# Patient Record
Sex: Male | Born: 1960 | Race: White | Marital: Married | Smoking: Current some day smoker
Health system: Southern US, Community
[De-identification: ages and names within clinical notes are randomized; demographics above are authoritative.]

## PROBLEM LIST (undated history)

## (undated) DIAGNOSIS — E785 Hyperlipidemia, unspecified: Secondary | ICD-10-CM

## (undated) DIAGNOSIS — F32A Depression, unspecified: Secondary | ICD-10-CM

## (undated) DIAGNOSIS — M1990 Unspecified osteoarthritis, unspecified site: Secondary | ICD-10-CM

## (undated) DIAGNOSIS — I2699 Other pulmonary embolism without acute cor pulmonale: Secondary | ICD-10-CM

## (undated) DIAGNOSIS — I82409 Acute embolism and thrombosis of unspecified deep veins of unspecified lower extremity: Secondary | ICD-10-CM

## (undated) DIAGNOSIS — I739 Peripheral vascular disease, unspecified: Secondary | ICD-10-CM

## (undated) DIAGNOSIS — K760 Fatty (change of) liver, not elsewhere classified: Secondary | ICD-10-CM

## (undated) DIAGNOSIS — N451 Epididymitis: Secondary | ICD-10-CM

## (undated) DIAGNOSIS — N189 Chronic kidney disease, unspecified: Secondary | ICD-10-CM

## (undated) DIAGNOSIS — C61 Malignant neoplasm of prostate: Secondary | ICD-10-CM

---

## 1983-06-29 HISTORY — PX: KNEE ARTHROSCOPY: SHX127

## 1993-06-28 HISTORY — PX: KNEE ARTHROSCOPY: SHX127

## 1999-06-29 HISTORY — PX: SHOULDER ARTHROSCOPY W/ ROTATOR CUFF REPAIR: SHX2400

## 2013-06-28 HISTORY — PX: TOTAL SHOULDER REPLACEMENT: SUR1217

## 2017-06-28 HISTORY — PX: MEDIAL PARTIAL KNEE REPLACEMENT: SHX5965

## 2018-06-28 HISTORY — PX: PROSTATECTOMY: SHX69

## 2019-07-15 DIAGNOSIS — N529 Male erectile dysfunction, unspecified: Secondary | ICD-10-CM | POA: Insufficient documentation

## 2019-07-15 DIAGNOSIS — C61 Malignant neoplasm of prostate: Secondary | ICD-10-CM | POA: Insufficient documentation

## 2020-02-26 DIAGNOSIS — Z9079 Acquired absence of other genital organ(s): Secondary | ICD-10-CM | POA: Insufficient documentation

## 2020-02-26 DIAGNOSIS — I82409 Acute embolism and thrombosis of unspecified deep veins of unspecified lower extremity: Secondary | ICD-10-CM | POA: Insufficient documentation

## 2020-02-26 DIAGNOSIS — F32A Depression, unspecified: Secondary | ICD-10-CM | POA: Insufficient documentation

## 2020-03-04 DIAGNOSIS — E781 Pure hyperglyceridemia: Secondary | ICD-10-CM | POA: Insufficient documentation

## 2020-06-26 DIAGNOSIS — K760 Fatty (change of) liver, not elsewhere classified: Secondary | ICD-10-CM | POA: Insufficient documentation

## 2020-07-07 DIAGNOSIS — N182 Chronic kidney disease, stage 2 (mild): Secondary | ICD-10-CM | POA: Insufficient documentation

## 2021-01-30 DIAGNOSIS — I482 Chronic atrial fibrillation, unspecified: Secondary | ICD-10-CM | POA: Insufficient documentation

## 2021-08-19 DIAGNOSIS — T8459XA Infection and inflammatory reaction due to other internal joint prosthesis, initial encounter: Secondary | ICD-10-CM | POA: Insufficient documentation

## 2021-08-21 ENCOUNTER — Other Ambulatory Visit: Payer: Self-pay | Admitting: Orthopedic Surgery

## 2021-08-21 DIAGNOSIS — Z96611 Presence of right artificial shoulder joint: Secondary | ICD-10-CM

## 2021-08-28 ENCOUNTER — Other Ambulatory Visit: Payer: Self-pay

## 2021-09-08 ENCOUNTER — Other Ambulatory Visit: Payer: Self-pay

## 2021-09-18 ENCOUNTER — Other Ambulatory Visit: Payer: Self-pay

## 2021-09-18 ENCOUNTER — Ambulatory Visit
Admission: RE | Admit: 2021-09-18 | Discharge: 2021-09-18 | Disposition: A | Discharge status: Home / Self Care | Payer: Self-pay | Source: Ambulatory Visit | Attending: Orthopedic Surgery | Admitting: Orthopedic Surgery

## 2021-09-18 ENCOUNTER — Ambulatory Visit
Admission: RE | Admit: 2021-09-18 | Discharge: 2021-09-18 | Disposition: A | Discharge status: Home / Self Care | Payer: BC Managed Care – PPO | Source: Ambulatory Visit | Attending: Orthopedic Surgery | Admitting: Orthopedic Surgery

## 2021-09-18 ENCOUNTER — Other Ambulatory Visit: Payer: Self-pay | Admitting: Orthopedic Surgery

## 2021-09-18 DIAGNOSIS — Z96611 Presence of right artificial shoulder joint: Secondary | ICD-10-CM

## 2021-09-18 MED ORDER — IOPAMIDOL (ISOVUE-M 200) INJECTION 41%
12.0000 mL | Freq: Once | INTRAMUSCULAR | Status: AC
  Administered 2021-09-18: 12 mL via INTRA_ARTICULAR

## 2021-10-08 NOTE — Progress Notes (Signed)
Surgery orders requested via Epic inbox. °

## 2021-10-14 NOTE — Patient Instructions (Addendum)
2 VISITORS ARE  ALLOWED TO COME WITH YOU AND STAY IN THE WAITING ROOM ONLY DURING PRE OP AND PROCEDURE.   ? ?**NO VISITORS ARE ALLOWED IN THE SHORT STAY AREA OR RECOVERY ROOM!!** ? ?IF YOU WILL BE ADMITTED INTO THE HOSPITAL YOU ARE ALLOWED ONLY 4  SUPPORT PEOPLE DURING VISITATION HOURS ONLY (7 AM -8PM)   ?The support person(s) must pass our screening, gel in and out, and wear a mask at all times, including in the patient?s room. ?Patients must also wear a mask when staff or their support person are in the room. ?Visitors GUEST BADGE MUST BE WORN VISIBLY  ?One adult visitor may remain with you overnight and MUST be in the room by 8 P.M. ?  ? ? Your procedure is scheduled on: 10/21/21 ? ? Report to [facility] Main Entrance ? ?  Report to admitting at 7:15 AM ? ? Call this number if you have problems the morning of surgery [phone] ? ? Do not eat food :After Midnight. ? ? After Midnight you may have the following liquids until _7:00 _AM DAY OF SURGERY ? ?Water ?Black Coffee (sugar ok, NO MILK/CREAM OR CREAMERS)  ?Tea (sugar ok, NO MILK/CREAM OR CREAMERS) regular and decaf                             ?Plain Jell-O (NO RED)                                           ?Fruit ices (not with fruit pulp, NO RED)                                     ?Popsicles (NO RED)                                                                  ?Juice: apple, WHITE grape, WHITE cranberry ?Sports drinks like Gatorade (NO RED) ?Clear broth(vegetable,chicken,beef) ? ?             ? ?  ?  ?The day of surgery:  ?Drink ONE (1) Pre-Surgery Clear Ensure  at  6:45 AM the morning of surgery. Drink in one sitting. Do not sip.  ?This drink was given to you during your hospital  ?pre-op appointment visit. ?Nothing else to drink after completing the  ?Pre-Surgery Clear Ensure at 7:00 AM ?  ?       If you have questions, please contact your surgeon?s office. ? ? ?  ?  ?Oral Hygiene is also important to reduce your risk of infection.                                     ?Remember - BRUSH YOUR TEETH THE MORNING OF SURGERY WITH YOUR REGULAR TOOTHPASTE ? ? Do NOT smoke after Midnight ? ? Take these medicines the morning of surgery with A SIP OF WATER: Fluvoxamine ? ? ?Bring CPAP mask and tubing  day of surgery. ?                  ?           You may not have any metal on your body including , jewelry, and body piercing ? ?           Do not wear lotions, powders, cologne, or deodorant ?  ? ?            Men may shave face and neck. ? ? Do not bring valuables to the hospital. [place] NOT ?            RESPONSIBLE   FOR VALUABLES. ? ? Contacts, dentures or bridgework may not be worn into surgery. ? ? Bring small overnight bag day of surgery. ?  ? Patients discharged on the day of surgery will not be allowed to drive home.  Someone NEEDS to stay with you for the first 24 hours after anesthesia. ? ? Special Instructions: Bring a copy of your healthcare power of attorney and living will documents  the day of surgery if you haven't scanned them before. ? ?            Please read over the following fact sheets you were given: IF [place] [phone] ? ? ?[place]- Preparing for Total Shoulder Arthroplasty  ?  ?Before surgery, you can play an important role. Because skin is not sterile, your skin needs to be as free of germs as possible. You can reduce the number of germs on your skin by using the following products. ?Benzoyl Peroxide Gel ?Reduces the number of germs present on the skin ?Applied twice a day to shoulder area starting two days before surgery   ? ?================================================================== ? ?Please follow these instructions carefully: ? ?BENZOYL PEROXIDE 5% GEL ? ?Please do not use if you have an allergy to benzoyl peroxide.   If your skin becomes reddened/irritated stop using the benzoyl peroxide. ? ?Starting two days before surgery, apply as follows: ?Apply benzoyl  peroxide in the morning and at night. Apply after taking a shower. If you are not taking a shower clean entire shoulder front, back, and side along with the armpit with a clean wet washcloth. ? ?Place a quarter-sized dollop on your shoulder and rub in thoroughly, making sure to cover the front, back, and side of your shoulder, along with the armpit.  ? ?2 days before ____ AM   ____ PM              1 day before ____ AM   ____ PM ?                        ?Do this twice a day for two days.  (Last application is the night before surgery, AFTER using the CHG soap as described below). ? ?Do NOT apply benzoyl peroxide gel on the day of surgery.  ? ?   [place] - Preparing for Surgery ?Before surgery, you can play an important role.  Because skin is not sterile, your skin needs to be as free of germs as possible.  You can reduce the number of germs on your skin by washing with CHG (chlorahexidine gluconate) soap before surgery.  CHG is an antiseptic cleaner which kills germs and bonds with the skin to continue killing germs even after washing. ?Please DO NOT use if you have an  allergy to CHG or antibacterial soaps.  If your skin becomes reddened/irritated stop using the CHG and inform your nurse when you arrive at Short Stay.  You may shave your face/neck. ?Please follow these instructions carefully: ? 1.  Shower with CHG Soap the night before surgery and the  morning of Surgery. ? 2.  If you choose to wash your hair, wash your hair first as usual with your  normal  shampoo. ? 3.  After you shampoo, rinse your hair and body thoroughly to remove the  shampoo.                       ?     4.  Use CHG as you would any other liquid soap.  You can apply chg directly  to the skin and wash  ?                     Gently with a scrungie or clean washcloth. ? 5.  Apply the CHG Soap to your body ONLY FROM THE NECK DOWN.   Do not use on face/ open      ?                     Wound or open sores. Avoid contact with eyes, ears mouth and  genitals (private parts).  ?                     Production manager,  Genitals (private parts) with your normal soap. ?            6.  Wash thoroughly, paying special attention to the area where your surgery  will be performed. ? 7.  Thoroughly rinse your body with warm water from the neck down. ? 8.  DO NOT shower/wash with your normal soap after using and rinsing off  the CHG Soap. ?               9.  Pat yourself dry with a clean towel. ?           10.  Wear clean pajamas. ?           11.  Place clean sheets on your bed the night of your first shower and do not  sleep with pets. ?Day of Surgery : ?Do not apply any lotions/deodorants the morning of surgery.  Please wear clean clothes to the hospital/surgery center. ? ?FAILURE TO FOLLOW THESE INSTRUCTIONS MAY RESULT IN THE CANCELLATION OF YOUR SURGERY ? ? ? ?________________________________________________________________________  ? ?Incentive Spirometer ? ?An incentive spirometer is a tool that can help keep your lungs clear and active. This tool measures how well you are filling your lungs with each breath. Taking long deep breaths may help reverse or decrease the chance of developing breathing (pulmonary) problems (especially infection) following: ?A long period of time when you are unable to move or be active. ?BEFORE THE PROCEDURE  ?If the spirometer includes an indicator to show your best effort, your nurse or respiratory therapist will set it to a desired goal. ?If possible, sit up straight or lean slightly forward. Try not to slouch. ?Hold the incentive spirometer in an upright position. ?INSTRUCTIONS FOR USE  ?Sit on the edge of your bed if possible, or sit up as far as you can in bed or on a chair. ?Hold the incentive spirometer in an upright position. ?Breathe out normally. ?Place the mouthpiece in your mouth  and seal your lips tightly around it. ?Breathe in slowly and as deeply as possible, raising the piston or the ball toward the top of the column. ?Hold your  breath for 3-5 seconds or for as long as possible. Allow the piston or ball to fall to the bottom of the column. ?Remove the mouthpiece from your mouth and breathe out normally. ?Rest for a few seconds

## 2021-10-15 ENCOUNTER — Encounter (HOSPITAL_COMMUNITY): Payer: Self-pay

## 2021-10-15 ENCOUNTER — Other Ambulatory Visit: Payer: Self-pay

## 2021-10-15 ENCOUNTER — Encounter (HOSPITAL_COMMUNITY)
Admission: RE | Admit: 2021-10-15 | Discharge: 2021-10-15 | Disposition: A | Discharge status: Home / Self Care | Payer: BC Managed Care – PPO | Source: Ambulatory Visit | Attending: Orthopaedic Surgery | Admitting: Orthopaedic Surgery

## 2021-10-15 VITALS — BP 135/92 | HR 69 | Temp 97.6°F | Resp 18 | Ht 71.0 in | Wt 237.2 lb

## 2021-10-15 DIAGNOSIS — Z01812 Encounter for preprocedural laboratory examination: Secondary | ICD-10-CM | POA: Insufficient documentation

## 2021-10-15 DIAGNOSIS — N289 Disorder of kidney and ureter, unspecified: Secondary | ICD-10-CM | POA: Diagnosis not present

## 2021-10-15 DIAGNOSIS — Z01818 Encounter for other preprocedural examination: Secondary | ICD-10-CM

## 2021-10-15 HISTORY — DX: Chronic kidney disease, unspecified: N18.9

## 2021-10-15 HISTORY — DX: Other pulmonary embolism without acute cor pulmonale: I26.99

## 2021-10-15 HISTORY — DX: Acute embolism and thrombosis of unspecified deep veins of unspecified lower extremity: I82.409

## 2021-10-15 HISTORY — DX: Epididymitis: N45.1

## 2021-10-15 HISTORY — DX: Fatty (change of) liver, not elsewhere classified: K76.0

## 2021-10-15 HISTORY — DX: Depression, unspecified: F32.A

## 2021-10-15 HISTORY — DX: Hyperlipidemia, unspecified: E78.5

## 2021-10-15 HISTORY — DX: Malignant neoplasm of prostate: C61

## 2021-10-15 LAB — BASIC METABOLIC PANEL
Anion gap: 8 (ref 5–15)
BUN: 27 mg/dL — ABNORMAL HIGH (ref 8–23)
CO2: 24 mmol/L (ref 22–32)
Calcium: 9 mg/dL (ref 8.9–10.3)
Chloride: 108 mmol/L (ref 98–111)
Creatinine, Ser: 1.29 mg/dL — ABNORMAL HIGH (ref 0.61–1.24)
GFR, Estimated: >60 mL/min (ref >60)
Glucose, Bld: 92 mg/dL (ref 70–99)
Potassium: 4.1 mmol/L (ref 3.5–5.1)
Sodium: 140 mmol/L (ref 135–145)

## 2021-10-15 LAB — CBC
HCT: 42.6 % (ref 39.0–52.0)
Hemoglobin: 14 g/dL (ref 13.0–17.0)
MCH: 32 pg (ref 26.0–34.0)
MCHC: 32.9 g/dL (ref 30.0–36.0)
MCV: 97.5 fL (ref 80.0–100.0)
Platelets: 244 10*3/uL (ref 150–400)
RBC: 4.37 MIL/uL (ref 4.22–5.81)
RDW: 13.2 % (ref 11.5–15.5)
WBC: 5.4 10*3/uL (ref 4.0–10.5)
nRBC: 0 % (ref 0.0–0.2)

## 2021-10-15 LAB — SURGICAL PCR SCREEN
MRSA, PCR: NEGATIVE
Staphylococcus aureus: NEGATIVE

## 2021-10-15 NOTE — Progress Notes (Signed)
Anesthesia note: ? ?Bowel prep reminder:NA ? ?PCP - DO M Mahoney and Lamarr Lulas NP [place] , [place] ?Cardiologist -none ?Other-  ? ?Chest x-ray - no ?EKG - no ?Stress Test - no ?ECHO - no ?Cardiac Cath - NA ? ?Pacemaker/ICD device last checked: ? ?Sleep Study - no ?CPAP -  ? ?Pt is pre diabetic-NA ?Fasting Blood Sugar -  ?Checks Blood Sugar _____ ? ?Blood Thinner:Xarelto for PE and DVT/ Dr. Chauncey Reading ?Blood Thinner Instructions:Stop 2 days prior/ Dr. Chauncey Reading ?Aspirin Instructions: ?Last Dose:4/23 ? ?Anesthesia review: no ? ?Patient denies shortness of breath, fever, cough and chest pain at PAT appointment ? ?Pt has no SOB with activities. He was working out prior to shoulder pain ? ?Patient verbalized understanding of instructions that were given to them at the PAT appointment. Patient was also instructed that they will need to review over the PAT instructions again at home before surgery. Yes ?

## 2021-10-20 NOTE — H&P (Signed)
? ? ?PREOPERATIVE H&P ? ?Chief Complaint: right shoulder failed hardware ? ?HPI: ?Chad Levy is a 61 y.o. male who is scheduled for Procedure(s): ?REVISION TOTAL SHOULDER TO REVERSE TOTAL SHOULDER.  ? ?Patient has a past medical history significant for DVT, PE, HLD, CKD.  ? 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  He is here to follow up on what his options are.  He has been very limited with his shoulder since the injury ? ?Symptoms are rated as moderate to severe, and have been worsening.  This is significantly impairing activities of daily living.   ? ?Please see clinic note for further details on this patient's care.   ? ?He has elected for surgical management.  ? ?Past Medical History:  ?Diagnosis Date  ? Chronic kidney disease   ? II  ? Depressive disorder   ? DVT (deep venous thrombosis) ([place])   ? Epididymitis   ? HLD (hyperlipidemia)   ? Prostate cancer ([place])   ? Pulmonary embolism ([place])   ? Steatosis of liver   ? ?Past Surgical History:  ?Procedure Laterality Date  ? KNEE ARTHROSCOPY  1995  ? meniscus removal  ? KNEE ARTHROSCOPY Left 1985  ? MEDIAL PARTIAL KNEE REPLACEMENT Right 2019  ? PROSTATECTOMY  2020  ? SHOULDER ARTHROSCOPY W/ ROTATOR CUFF REPAIR Right 2001  ? TOTAL SHOULDER REPLACEMENT Right 2015  ? ?Social History  ? ?Socioeconomic History  ? Marital status: Married  ?  Spouse name: Not on file  ? Number of children: Not on file  ? Years of education: Not on file  ? Highest education level: Not on file  ?Occupational History  ? Not on file  ?Tobacco Use  ? Smoking status: Former  ?  Packs/day: 0.50  ?  Years: 38.00  ?  Pack years: 19.00  ?  Types: Cigarettes  ?  Quit date: 2020  ?  Years since quitting: 3.3  ? Smokeless tobacco: Current  ?  Types: Chew  ?Vaping Use  ? Vaping Use: Never used  ?Substance and Sexual Activity  ? Alcohol use: Never  ? Drug use: Never  ? Sexual activity: Not on file  ?Other Topics Concern  ? Not on  file  ?Social History Narrative  ? Not on file  ? ?Social Determinants of Health  ? ?Financial Resource Strain: Not on file  ?Food Insecurity: Not on file  ?Transportation Needs: Not on file  ?Physical Activity: Not on file  ?Stress: Not on file  ?Social Connections: Not on file  ? ?No family history on file. ?No Known Allergies ?Prior to Admission medications   ?Medication Sig Start Date End Date Taking? Authorizing Provider  ?acetaminophen (TYLENOL) 500 MG tablet Take 1,000-1,500 mg by mouth every 6 (six) hours as needed for moderate pain or mild pain.   Yes [provider]  ?diclofenac (VOLTAREN) 75 MG EC tablet Take 75 mg by mouth 2 (two) times daily.   Yes [provider]  ?fluvoxaMINE (LUVOX) 100 MG tablet Take 100 mg by mouth 2 (two) times daily. 08/21/21  Yes [provider]  ?rivaroxaban (XARELTO) 20 MG TABS tablet Take 20 mg by mouth daily. 04/21/20  Yes [provider]  ? ? ?ROS: All other systems have been reviewed and were otherwise negative with the exception of those mentioned in the HPI and as above. ? ?Physical Exam: ?General: Alert, no acute distress ?Cardiovascular: No pedal edema ?Respiratory:  No cyanosis, no use of accessory musculature ?GI: No organomegaly, abdomen is soft and non-tender ?Skin: No lesions in the area of chief complaint ?Neurologic: Sensation intact distally ?Psychiatric: Patient is competent for consent with normal mood and affect ?Lymphatic: No axillary or cervical lymphadenopathy ? ?MUSCULOSKELETAL:  ?Active forward elevation to 90, passive to 100.  External rotation of 30, internal rotation to back pocket.  Weak cuff strength testing, supraspinatus and subscapularis.   ? ?Imaging: ?CT arthrogram demonstrates well fixed components with signs of anterior superior escape. ? ?BMI: ?There is no height or weight on file to calculate BMI. ? ?No results found for: ALBUMIN ? ? ?Diabetes: ?Patient does not have a diagnosis of diabetes. ?  ? ?.  ?   ?Smoking Status: ?Social History  ? ?Tobacco Use  ?Smoking Status Former  ? Packs/day: 0.50  ? Years: 38.00  ? Pack years: 19.00  ? Types: Cigarettes  ? Quit date: 2020  ? Years since quitting: 3.3  ?Smokeless Tobacco Current  ? Types: Chew  ? ?Ready to quit: Not Answered ?Counseling given: Not Answered ? ? ? ? ? ? ? ? ? ? ? ? 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 ? ?Plan: ?Plan for Procedure(s): ?REVISION TOTAL SHOULDER TO REVERSE TOTAL SHOULDER ? ?Rotator cuff has failed.  We think it is appropriate to consider a revision to a reverse total shoulder arthroplasty.  We will do this as a one stage.  If there is pus we may end up having to do a spacer.  He understands the risks, including fracture, instability, infection and incomplete resolution of symptoms. ? ?The risks benefits and alternatives were discussed with the patient including but not limited to the risks of nonoperative treatment, versus surgical intervention including infection, bleeding, nerve injury,  blood clots, cardiopulmonary complications, morbidity, mortality, among others, and they were willing to proceed.  ? ?We additionally specifically discussed risks of axillary nerve injury, infection, periprosthetic fracture, continued pain and longevity of implants prior to beginning procedure.  ?  ?Patient will be closely monitored in PACU for medical stabilization and pain control. If found stable in PACU, patient may be discharged home with outpatient follow-up. If any concerns regarding patient's stabilization patient will be admitted for observation after surgery. The patient is planning to be discharged home with outpatient PT.  ? ?The patient acknowledged the explanation, agreed to proceed with the plan and consent was signed.  ? ?He received operative clearance from PCP, Dr. Emelda Fear. He will stop Xarelto 2 days prior to surgery and resume the day after surgery ? ?Operative Plan: Right  revision to reverse total shoulder arthroplasty  ?Discharge Medications: Standard ?DVT Prophylaxis: Resume Xarelto ?Physical Therapy: Outpatient PT ?Special Discharge needs: [facility] [facility] ? ? ?Ethelda Chick, PA-C ? ?10/20/2021 ?1:25 PM ? ?

## 2021-10-21 ENCOUNTER — Inpatient Hospital Stay (HOSPITAL_COMMUNITY): Payer: BC Managed Care – PPO | Admitting: Anesthesiology

## 2021-10-21 ENCOUNTER — Ambulatory Visit (HOSPITAL_COMMUNITY)
Admission: RE | Admit: 2021-10-21 | Discharge: 2021-10-21 | Disposition: A | Discharge status: Home / Self Care | Payer: BC Managed Care – PPO | Source: Ambulatory Visit | Attending: Orthopaedic Surgery | Admitting: Orthopaedic Surgery

## 2021-10-21 ENCOUNTER — Encounter (HOSPITAL_COMMUNITY)
Admission: RE | Disposition: A | Discharge status: Home / Self Care | Payer: Self-pay | Source: Ambulatory Visit | Attending: Orthopaedic Surgery

## 2021-10-21 ENCOUNTER — Inpatient Hospital Stay (HOSPITAL_COMMUNITY): Payer: BC Managed Care – PPO

## 2021-10-21 ENCOUNTER — Encounter (HOSPITAL_COMMUNITY): Payer: Self-pay | Admitting: Orthopaedic Surgery

## 2021-10-21 ENCOUNTER — Other Ambulatory Visit: Payer: Self-pay

## 2021-10-21 DIAGNOSIS — Y792 Prosthetic and other implants, materials and accessory orthopedic devices associated with adverse incidents: Secondary | ICD-10-CM | POA: Diagnosis not present

## 2021-10-21 DIAGNOSIS — Z96611 Presence of right artificial shoulder joint: Secondary | ICD-10-CM | POA: Insufficient documentation

## 2021-10-21 DIAGNOSIS — N182 Chronic kidney disease, stage 2 (mild): Secondary | ICD-10-CM | POA: Insufficient documentation

## 2021-10-21 DIAGNOSIS — Z86711 Personal history of pulmonary embolism: Secondary | ICD-10-CM | POA: Insufficient documentation

## 2021-10-21 DIAGNOSIS — Z87891 Personal history of nicotine dependence: Secondary | ICD-10-CM | POA: Insufficient documentation

## 2021-10-21 DIAGNOSIS — Z7901 Long term (current) use of anticoagulants: Secondary | ICD-10-CM | POA: Insufficient documentation

## 2021-10-21 DIAGNOSIS — I129 Hypertensive chronic kidney disease with stage 1 through stage 4 chronic kidney disease, or unspecified chronic kidney disease: Secondary | ICD-10-CM | POA: Diagnosis not present

## 2021-10-21 DIAGNOSIS — T84098A Other mechanical complication of other internal joint prosthesis, initial encounter: Secondary | ICD-10-CM | POA: Insufficient documentation

## 2021-10-21 HISTORY — PX: REVISION TOTAL SHOULDER TO REVERSE TOTAL SHOULDER: SHX6313

## 2021-10-21 LAB — TYPE AND SCREEN
ABO/RH(D): A POS
Antibody Screen: NEGATIVE

## 2021-10-21 LAB — ABO/RH: ABO/RH(D): A POS

## 2021-10-21 SURGERY — REVISION, REVERSE TOTAL ARTHROPLASTY, SHOULDER
Anesthesia: General | Site: Shoulder | Laterality: Right

## 2021-10-21 MED ORDER — ONDANSETRON HCL 4 MG/2ML IJ SOLN
INTRAMUSCULAR | Status: DC | PRN
  Administered 2021-10-21: 4 mg via INTRAVENOUS

## 2021-10-21 MED ORDER — CEFAZOLIN SODIUM-DEXTROSE 2-4 GM/100ML-% IV SOLN
2.0000 g | INTRAVENOUS | Status: AC
  Administered 2021-10-21: 2 g via INTRAVENOUS
  Filled 2021-10-21: qty 100

## 2021-10-21 MED ORDER — ACETAMINOPHEN 500 MG PO TABS
1000.0000 mg | ORAL_TABLET | Freq: Once | ORAL | Status: AC
  Administered 2021-10-21: 1000 mg via ORAL
  Filled 2021-10-21: qty 2

## 2021-10-21 MED ORDER — CHLORHEXIDINE GLUCONATE 0.12 % MT SOLN: 15.0000 mL | Freq: Once | OROMUCOSAL | Status: AC

## 2021-10-21 MED ORDER — DOXYCYCLINE HYCLATE 50 MG PO CAPS: 100.0000 mg | ORAL_CAPSULE | Freq: Two times a day (BID) | ORAL | 0 refills | Status: DC

## 2021-10-21 MED ORDER — FENTANYL CITRATE PF 50 MCG/ML IJ SOSY
50.0000 ug | PREFILLED_SYRINGE | INTRAMUSCULAR | Status: DC
  Administered 2021-10-21: 50 ug via INTRAVENOUS
  Filled 2021-10-21: qty 2

## 2021-10-21 MED ORDER — FENTANYL CITRATE (PF) 250 MCG/5ML IJ SOLN
INTRAMUSCULAR | Status: DC | PRN
  Administered 2021-10-21: 100 ug via INTRAVENOUS
  Administered 2021-10-21 (×2): 50 ug via INTRAVENOUS

## 2021-10-21 MED ORDER — PHENYLEPHRINE HCL (PRESSORS) 10 MG/ML IV SOLN
INTRAVENOUS | Status: AC
  Filled 2021-10-21: qty 1

## 2021-10-21 MED ORDER — VANCOMYCIN HCL 1000 MG IV SOLR
INTRAVENOUS | Status: AC
  Filled 2021-10-21: qty 20

## 2021-10-21 MED ORDER — SUGAMMADEX SODIUM 500 MG/5ML IV SOLN
INTRAVENOUS | Status: AC
  Filled 2021-10-21: qty 5

## 2021-10-21 MED ORDER — VANCOMYCIN HCL 1 G IV SOLR
INTRAVENOUS | Status: DC | PRN
Start: 2021-10-21 — End: 2021-10-22
  Administered 2021-10-21: 1000 mg via TOPICAL

## 2021-10-21 MED ORDER — MIDAZOLAM HCL 2 MG/2ML IJ SOLN
2.0000 mg | INTRAMUSCULAR | Status: DC
  Administered 2021-10-21: 1 mg via INTRAVENOUS
  Filled 2021-10-21: qty 2

## 2021-10-21 MED ORDER — ROCURONIUM BROMIDE 10 MG/ML (PF) SYRINGE
PREFILLED_SYRINGE | INTRAVENOUS | Status: AC
  Filled 2021-10-21: qty 10

## 2021-10-21 MED ORDER — DEXAMETHASONE SODIUM PHOSPHATE 10 MG/ML IJ SOLN
INTRAMUSCULAR | Status: DC | PRN
Start: 2021-10-21 — End: 2021-10-21
  Administered 2021-10-21: 5 mg via INTRAVENOUS

## 2021-10-21 MED ORDER — PROPOFOL 10 MG/ML IV BOLUS
INTRAVENOUS | Status: AC
  Filled 2021-10-21: qty 20

## 2021-10-21 MED ORDER — FENTANYL CITRATE (PF) 100 MCG/2ML IJ SOLN
INTRAMUSCULAR | Status: AC
  Filled 2021-10-21: qty 2

## 2021-10-21 MED ORDER — BUPIVACAINE-EPINEPHRINE (PF) 0.5% -1:200000 IJ SOLN
INTRAMUSCULAR | Status: DC | PRN
  Administered 2021-10-21: 15 mL via PERINEURAL

## 2021-10-21 MED ORDER — SODIUM CHLORIDE 0.9 % IR SOLN
Status: DC | PRN
  Administered 2021-10-21: 1000 mL

## 2021-10-21 MED ORDER — LIDOCAINE HCL (PF) 2 % IJ SOLN
INTRAMUSCULAR | Status: AC
  Filled 2021-10-21: qty 5

## 2021-10-21 MED ORDER — OXYCODONE HCL 5 MG/5ML PO SOLN: 5.0000 mg | Freq: Once | ORAL | Status: DC | PRN

## 2021-10-21 MED ORDER — ONDANSETRON HCL 4 MG PO TABS: 4.0000 mg | ORAL_TABLET | Freq: Three times a day (TID) | ORAL | 0 refills | Status: AC | PRN

## 2021-10-21 MED ORDER — PROPOFOL 10 MG/ML IV BOLUS
INTRAVENOUS | Status: DC | PRN
  Administered 2021-10-21: 200 mg via INTRAVENOUS

## 2021-10-21 MED ORDER — PHENYLEPHRINE HCL-NACL 20-0.9 MG/250ML-% IV SOLN
INTRAVENOUS | Status: DC | PRN
  Administered 2021-10-21: 25 ug/min via INTRAVENOUS

## 2021-10-21 MED ORDER — ORAL CARE MOUTH RINSE
15.0000 mL | Freq: Once | OROMUCOSAL | Status: AC
  Administered 2021-10-21: 15 mL via OROMUCOSAL

## 2021-10-21 MED ORDER — OXYCODONE HCL 5 MG PO TABS: 5.0000 mg | ORAL_TABLET | Freq: Once | ORAL | Status: DC | PRN

## 2021-10-21 MED ORDER — ACETAMINOPHEN 500 MG PO TABS: 1000.0000 mg | ORAL_TABLET | Freq: Three times a day (TID) | ORAL | 0 refills | Status: AC

## 2021-10-21 MED ORDER — OXYCODONE HCL 5 MG PO TABS
ORAL_TABLET | ORAL | 0 refills | Status: AC
Start: 2021-10-21 — End: 2021-10-26

## 2021-10-21 MED ORDER — LIDOCAINE 2% (20 MG/ML) 5 ML SYRINGE
INTRAMUSCULAR | Status: DC | PRN
  Administered 2021-10-21: 100 mg via INTRAVENOUS

## 2021-10-21 MED ORDER — STERILE WATER FOR IRRIGATION IR SOLN
Status: DC | PRN
  Administered 2021-10-21: 2000 mL

## 2021-10-21 MED ORDER — DEXAMETHASONE SODIUM PHOSPHATE 10 MG/ML IJ SOLN
INTRAMUSCULAR | Status: AC
  Filled 2021-10-21: qty 1

## 2021-10-21 MED ORDER — BUPIVACAINE LIPOSOME 1.3 % IJ SUSP
INTRAMUSCULAR | Status: DC | PRN
  Administered 2021-10-21: 10 mL via PERINEURAL

## 2021-10-21 MED ORDER — DOXYCYCLINE HYCLATE 100 MG PO CAPS: 100.0000 mg | ORAL_CAPSULE | Freq: Two times a day (BID) | ORAL | 0 refills | Status: DC

## 2021-10-21 MED ORDER — FENTANYL CITRATE PF 50 MCG/ML IJ SOSY: 25.0000 ug | PREFILLED_SYRINGE | INTRAMUSCULAR | Status: DC | PRN

## 2021-10-21 MED ORDER — TRANEXAMIC ACID-NACL 1000-0.7 MG/100ML-% IV SOLN
1000.0000 mg | INTRAVENOUS | Status: AC
  Administered 2021-10-21: 1000 mg via INTRAVENOUS
  Filled 2021-10-21: qty 100

## 2021-10-21 MED ORDER — LACTATED RINGERS IV SOLN: INTRAVENOUS | Status: DC

## 2021-10-21 MED ORDER — 0.9 % SODIUM CHLORIDE (POUR BTL) OPTIME
TOPICAL | Status: DC | PRN
  Administered 2021-10-21: 1000 mL

## 2021-10-21 MED ORDER — ONDANSETRON HCL 4 MG/2ML IJ SOLN
INTRAMUSCULAR | Status: AC
  Filled 2021-10-21: qty 2

## 2021-10-21 MED ORDER — ROCURONIUM BROMIDE 10 MG/ML (PF) SYRINGE
PREFILLED_SYRINGE | INTRAVENOUS | Status: DC | PRN
  Administered 2021-10-21: 60 mg via INTRAVENOUS
  Administered 2021-10-21: 40 mg via INTRAVENOUS
  Administered 2021-10-21 (×2): 20 mg via INTRAVENOUS

## 2021-10-21 MED ORDER — LACTATED RINGERS IV BOLUS
250.0000 mL | Freq: Once | INTRAVENOUS | Status: AC
  Administered 2021-10-21: 250 mL via INTRAVENOUS

## 2021-10-21 MED ORDER — LACTATED RINGERS IV BOLUS
500.0000 mL | Freq: Once | INTRAVENOUS | Status: AC
  Administered 2021-10-21: 500 mL via INTRAVENOUS

## 2021-10-21 MED ORDER — SUGAMMADEX SODIUM 500 MG/5ML IV SOLN
INTRAVENOUS | Status: DC | PRN
Start: 2021-10-21 — End: 2021-10-21
  Administered 2021-10-21: 200 mg via INTRAVENOUS

## 2021-10-21 SURGICAL SUPPLY — 79 items
AUGMENT BASEPLATE 25M 35D WEDG (Joint) IMPLANT
BAG COUNTER SPONGE SURGICOUNT (BAG) ×2 IMPLANT
BASEPLATE AUGMNT 25M 35D WEDGE (Joint) ×2 IMPLANT
BIT DRILL 3.2 PERIPHERAL SCREW (BIT) ×1 IMPLANT
BLADE FLEX CHISEL 8 2.5 (MISCELLANEOUS) ×1 IMPLANT
BLADE OSCILLATING/SAGITTAL (BLADE) ×1
BLADE SAW SAG 73X25 THK (BLADE) ×2
BLADE SAW SGTL 73X25 THK (BLADE) ×1 IMPLANT
BLADE SW THK.38XMED LNG THN (BLADE) IMPLANT
CHLORAPREP W/TINT 26 (MISCELLANEOUS) ×4 IMPLANT
CLSR STERI-STRIP ANTIMIC 1/2X4 (GAUZE/BANDAGES/DRESSINGS) ×2 IMPLANT
COOLER ICEMAN CLASSIC (MISCELLANEOUS) ×1 IMPLANT
COVER BACK TABLE 60X90IN (DRAPES) ×1 IMPLANT
COVER SURGICAL LIGHT HANDLE (MISCELLANEOUS) ×2 IMPLANT
DRAPE C-ARM 42X120 X-RAY (DRAPES) IMPLANT
DRAPE INCISE IOBAN 66X45 STRL (DRAPES) ×2 IMPLANT
DRAPE ORTHO SPLIT 77X108 STRL (DRAPES) ×2
DRAPE SHEET LG 3/4 BI-LAMINATE (DRAPES) ×4 IMPLANT
DRAPE SURG ORHT 6 SPLT 77X108 (DRAPES) ×2 IMPLANT
DRSG AQUACEL AG ADV 3.5X 6 (GAUZE/BANDAGES/DRESSINGS) ×2 IMPLANT
DRSG AQUACEL AG ADV 3.5X14 (GAUZE/BANDAGES/DRESSINGS) ×1 IMPLANT
ELECT BLADE TIP CTD 4 INCH (ELECTRODE) ×2 IMPLANT
ELECT PENCIL ROCKER SW 15FT (MISCELLANEOUS) IMPLANT
ELECT REM PT RETURN 15FT ADLT (MISCELLANEOUS) ×2 IMPLANT
FACESHIELD WRAPAROUND (MASK) ×2 IMPLANT
FACESHIELD WRAPAROUND OR TEAM (MASK) ×1 IMPLANT
GAUZE XEROFORM 5X9 LF (GAUZE/BANDAGES/DRESSINGS) ×1 IMPLANT
GLENOSPHERE PERFORM 39 3 (Shoulder) ×1 IMPLANT
GLOVE BIO SURGEON STRL SZ 6.5 (GLOVE) ×4 IMPLANT
GLOVE BIOGEL PI IND STRL 6.5 (GLOVE) ×1 IMPLANT
GLOVE BIOGEL PI IND STRL 8 (GLOVE) ×1 IMPLANT
GLOVE BIOGEL PI INDICATOR 6.5 (GLOVE) ×1
GLOVE BIOGEL PI INDICATOR 8 (GLOVE) ×1
GLOVE ECLIPSE 8.0 STRL XLNG CF (GLOVE) ×4 IMPLANT
GOWN STRL REUS W/ TWL LRG LVL3 (GOWN DISPOSABLE) ×1 IMPLANT
GOWN STRL REUS W/TWL LRG LVL3 (GOWN DISPOSABLE) ×1
GUIDEWIRE GLENOID 2.5X220 (WIRE) ×1 IMPLANT
HANDPIECE INTERPULSE COAX TIP (DISPOSABLE) ×1
INSERT REV KIT SHOULDER 6X39 (Screw) ×1 IMPLANT
JET LAVAGE IRRISEPT WOUND (IRRIGATION / IRRIGATOR) ×2
KIT BASIN OR (CUSTOM PROCEDURE TRAY) ×2 IMPLANT
KIT STABILIZATION SHOULDER (MISCELLANEOUS) ×2 IMPLANT
KIT TURNOVER KIT A (KITS) ×1 IMPLANT
LAVAGE JET IRRISEPT WOUND (IRRIGATION / IRRIGATOR) IMPLANT
MANIFOLD NEPTUNE II (INSTRUMENTS) ×2 IMPLANT
NDL MAYO CATGUT SZ4 TPR NDL (NEEDLE) IMPLANT
NEEDLE MAYO CATGUT SZ4 (NEEDLE) IMPLANT
NS IRRIG 1000ML POUR BTL (IV SOLUTION) ×2 IMPLANT
PACK SHOULDER (CUSTOM PROCEDURE TRAY) ×2 IMPLANT
PAD COLD SHLDR WRAP-ON (PAD) ×1 IMPLANT
PAD ORTHO SHOULDER 7X19 LRG (SOFTGOODS) ×1 IMPLANT
RESTRAINT HEAD UNIVERSAL NS (MISCELLANEOUS) ×2 IMPLANT
SCREW 5.5X26 (Screw) ×4 IMPLANT
SCREW BONE 6.5X40 SM (Screw) ×1 IMPLANT
SET HNDPC FAN SPRY TIP SCT (DISPOSABLE) ×1 IMPLANT
SLING ARM IMMOBILIZER LRG (SOFTGOODS) ×1 IMPLANT
SLING ULTRA II L (ORTHOPEDIC SUPPLIES) ×1 IMPLANT
SLING ULTRA III MED (ORTHOPEDIC SUPPLIES) ×1 IMPLANT
SPHERE GLENOD +3X39XLATERALIZE (Shoulder) IMPLANT
SPHR GLND +3X39XLATERALIZE (Shoulder) ×1 IMPLANT
SPONGE T-LAP 18X18 ~~LOC~~+RFID (SPONGE) ×2 IMPLANT
SPONGE T-LAP 4X18 ~~LOC~~+RFID (SPONGE) ×2 IMPLANT
STEM HUMERAL 3B LONG 98 (Stem) IMPLANT
STEM HUMERAL SZ 3B LONG 98MM (Stem) ×1 IMPLANT
SUCTION FRAZIER HANDLE 12FR (TUBING) ×1
SUCTION TUBE FRAZIER 12FR DISP (TUBING) ×1 IMPLANT
SUT ETHIBOND 2 V 37 (SUTURE) ×3 IMPLANT
SUT ETHIBOND NAB CT1 #1 30IN (SUTURE) ×3 IMPLANT
SUT ETHILON 2 0 PS N (SUTURE) ×1 IMPLANT
SUT FIBERWIRE #5 38 CONV NDL (SUTURE) ×8
SUT MNCRL AB 4-0 PS2 18 (SUTURE) ×2 IMPLANT
SUT VIC AB 0 CT1 36 (SUTURE) IMPLANT
SUT VIC AB 3-0 SH 27 (SUTURE) ×1
SUT VIC AB 3-0 SH 27X BRD (SUTURE) ×1 IMPLANT
SUTURE FIBERWR #5 38 CONV NDL (SUTURE) IMPLANT
TOWEL OR 17X26 10 PK STRL BLUE (TOWEL DISPOSABLE) ×2 IMPLANT
TRAY REV FLEX AEQUALIS 3.5X6 (Shoulder) ×1 IMPLANT
TUBE SUCTION HIGH CAP CLEAR NV (SUCTIONS) ×2 IMPLANT
WATER STERILE IRR 1000ML POUR (IV SOLUTION) ×4 IMPLANT

## 2021-10-21 NOTE — Anesthesia Procedure Notes (Signed)
Procedure Name: Intubation ?Date/Time: 10/21/2021 2:22 PM ?Performed by: Lollie Sails, CRNA ?Pre-anesthesia Checklist: Patient identified, Emergency Drugs available, Suction available, Patient being monitored and Timeout performed ?Patient Re-evaluated:Patient Re-evaluated prior to induction ?Oxygen Delivery Method: Circle system utilized ?Preoxygenation: Pre-oxygenation with 100% oxygen ?Induction Type: IV induction ?Ventilation: Two handed mask ventilation required and Oral airway inserted - appropriate to patient size ?Laryngoscope Size: Sabra Heck and 3 ?Grade View: Grade II ?Tube type: Oral ?Number of attempts: 1 ?Airway Equipment and Method: Stylet ?Placement Confirmation: ETT inserted through vocal cords under direct vision, positive ETCO2 and breath sounds checked- equal and bilateral ?Secured at: 23 cm ?Tube secured with: Tape ?Dental Injury: Teeth and Oropharynx as per pre-operative assessment  ?Comments: Minimal blood noted to left lower canine tooth from oral airway.     No damage to tooth.  No injury noted to gum.   ? ? ? ? ?

## 2021-10-21 NOTE — Progress Notes (Signed)
AssistedDr. Birdie Sons with right, interscalene , ultrasound guided block. Side rails up, monitors on throughout procedure. See vital signs in flow sheet. Tolerated Procedure well. ? ?

## 2021-10-21 NOTE — Anesthesia Procedure Notes (Signed)
Anesthesia Regional Block: Interscalene brachial plexus block  ? ?Pre-Anesthetic Checklist: , timeout performed,  Correct Patient, Correct Site, Correct Laterality,  Correct Procedure, Correct Position, site marked,  Risks and benefits discussed,  Pre-op evaluation,  At surgeon's request and post-op pain management ? ?Laterality: Right ? ?Prep: Maximum Sterile Barrier Precautions used, chloraprep     ?  ?Needles:  ?Injection technique: Single-shot ? ?Needle Type: Echogenic Stimulator Needle   ? ? ?Needle Length: 9cm  ?Needle Gauge: 22  ? ? ? ?Additional Needles: ? ? ?Procedures:,,,, ultrasound used (permanent image in chart),,    ?Narrative:  ?Start time: 10/21/2021 2:08 PM ?End time: 10/21/2021 2:11 PM ?Injection made incrementally with aspirations every 5 mL. ? ?Performed by: Personally  ?Anesthesiologist: Brennan Bailey, MD ? ?Additional Notes: ?Risks, benefits, and alternative discussed. Patient gave consent for procedure. Patient prepped and draped in sterile fashion. Sedation administered, patient remains easily responsive to voice. Relevant anatomy identified with ultrasound guidance. Local anesthetic given in 5cc increments with no signs or symptoms of intravascular injection. No pain or paraesthesias with injection. Patient monitored throughout procedure with signs of LAST or immediate complications. Tolerated well. Ultrasound image placed in chart.  ?Tawny Asal, MD ? ? ? ? ? ? ?

## 2021-10-21 NOTE — Anesthesia Preprocedure Evaluation (Addendum)
Anesthesia Evaluation  ?Patient identified by MRN, date of birth, ID band ?Patient awake ? ? ? ?Reviewed: ?Allergy & Precautions, NPO status , Patient's Chart, lab work & pertinent test results ? ?History of Anesthesia Complications ?Negative for: history of anesthetic complications ? ?Airway ?Mallampati: II ? ?TM Distance: >3 FB ?Neck ROM: Full ? ? ? Dental ? ?(+) Missing,  ?  ?Pulmonary ?former smoker, PE (on Xarelto) ?  ?Pulmonary exam normal ? ? ? ? ? ? ? Cardiovascular ?negative cardio ROS ?Normal cardiovascular exam ? ? ?  ?Neuro/Psych ?Depression negative neurological ROS ?   ? GI/Hepatic ?negative GI ROS, Neg liver ROS,   ?Endo/Other  ?negative endocrine ROS ? Renal/GU ?Renal InsufficiencyRenal disease  ?negative genitourinary ?  ?Musculoskeletal ?negative musculoskeletal ROS ?(+)  ? Abdominal ?  ?Peds ? Hematology ?negative hematology ROS ?(+)   ?Anesthesia Other Findings ?Day of surgery medications reviewed with patient. ? Reproductive/Obstetrics ?negative OB ROS ? ?  ? ? ? ? ? ? ? ? ? ? ? ? ? ?  ?  ? ? ? ? ? ? ? ?Anesthesia Physical ?Anesthesia Plan ? ?ASA: 2 ? ?Anesthesia Plan: General  ? ?Post-op Pain Management: Regional block* and Tylenol PO (pre-op)*  ? ?Induction: Intravenous ? ?PONV Risk Score and Plan: 2 and Treatment may vary due to age or medical condition, Midazolam, Dexamethasone and Ondansetron ? ?Airway Management Planned: Oral ETT ? ?Additional Equipment: None ? ?Intra-op Plan:  ? ?Post-operative Plan: Extubation in OR ? ?Informed Consent: I have reviewed the patients History and Physical, chart, labs and discussed the procedure including the risks, benefits and alternatives for the proposed anesthesia with the patient or authorized representative who has indicated his/her understanding and acceptance.  ? ? ? ?Dental advisory given ? ?Plan Discussed with: CRNA ? ?Anesthesia Plan Comments:   ? ? ? ? ? ?Anesthesia Quick Evaluation ? ?

## 2021-10-21 NOTE — Discharge Instructions (Signed)
Ophelia Charter MD, MPH ?Noemi Chapel, PA-C ?[facility] ?[address] ?[phone] (tel)   ?[phone] (fax) ? ? ?POST-OPERATIVE INSTRUCTIONS - TOTAL SHOULDER REPLACEMENT  ? ? ?WOUND CARE ?You may leave the operative dressing in place until your follow-up appointment. ?KEEP THE INCISIONS CLEAN AND DRY. ?There may be a small amount of fluid/bleeding leaking at the surgical site. This is normal after surgery.  ?If it fills with liquid or blood please call us immediately to change it for you. ?Use the provided ice machine or Ice packs as often as possible for the first 3-4 days, then as needed for pain relief.   ?Keep a layer of cloth or a shirt between your skin and the cooling unit to prevent frost bite as it can get very cold. ? ?SHOWERING: ?- You may shower on Post-Op Day #2.  ?- The dressing is water resistant but do not scrub it as it may start to peel up.   ?- You may remove the sling for showering ?- Gently pat the area dry.  ?- Do not soak the shoulder in water. Do not go swimming in the pool or ocean until your sutures are removed and your incision has completely healed. ?- KEEP THE INCISIONS CLEAN AND DRY. ? ?EXERCISES ?Wear the sling at all times ?You may remove the sling for showering, but keep the arm across the chest or in a secondary sling.    ?Accidental/Purposeful External Rotation and shoulder flexion (reaching behind you) is to be avoided at all costs for the first month. ?Do not lift anything heavier than 1 pound until we discuss it further in clinic. ? ?REGIONAL ANESTHESIA (NERVE BLOCKS) ?The anesthesia team may have performed a nerve block for you if safe in the setting of your care.  This is a great tool used to minimize pain.  Typically the block may start wearing off overnight but the long acting medicine may last for 3-4 days.  The nerve block wearing off can be a challenging period but please utilize your as needed pain medications to try and manage this  period.   ? ?POST-OP MEDICATIONS- Multimodal approach to pain control ?In general your pain will be controlled with a combination of substances.  Prescriptions unless otherwise discussed are electronically sent to your pharmacy.  This is a carefully made plan we use to minimize narcotic use.    ? ?Acetaminophen - Non-narcotic pain medicine taken on a scheduled basis  ?Oxycodone - This is a strong narcotic, to be used only on an ?as needed? basis for SEVERE pain. ?Zofran -  take as needed for nausea ?Doxycycline - this is an antibiotic, take as instructed ? ?You may resume your Xarelto 24 hours after surgery  ? ? ?FOLLOW-UP ?If you develop a Fever (>101.5), Redness or Drainage from the surgical incision site, please call our office to arrange for an evaluation. ?Please call the office to schedule a follow-up appointment for a wound check, 7-10 days post-operatively. ? ?IF YOU HAVE ANY QUESTIONS, PLEASE FEEL FREE TO CALL OUR OFFICE. ? ?HELPFUL INFORMATION ? ?If you had a block, it will wear off between 8-24 hrs postop typically.  This is period when your pain may go from nearly zero to the pain you would have had post-op without the block.  This is an abrupt transition but nothing dangerous is happening.  You may take an extra dose of narcotic when this happens. ? ?Your arm will be in a sling following surgery.  You will be in this sling for the next 4 weeks.   ? ?You may be more comfortable sleeping in a semi-seated position the first few nights following surgery.  Keep a pillow propped under the elbow and forearm for comfort.  If you have a recliner type of chair it might be beneficial.  If not that is fine too, but it would be helpful to sleep propped up with pillows behind your operated shoulder as well under your elbow and forearm.  This will reduce pulling on the suture lines. ? ?When dressing, put your operative arm in the sleeve first.  When getting undressed, take your operative arm out last.  Loose fitting,  button-down shirts are recommended. ? ?In most states it is against the law to drive while your arm is in a sling. And certainly against the law to drive while taking narcotics. ? ?You may return to work/school in the next couple of days when you feel up to it. Desk work and typing in the sling is fine. ? ?We suggest you use the pain medication the first night prior to going to bed, in order to ease any pain when the anesthesia wears off. You should avoid taking pain medications on an empty stomach as it will make you nauseous. ? ?Do not drink alcoholic beverages or take illicit drugs when taking pain medications. ? ?Pain medication may make you constipated.  Below are a few solutions to try in this order: ?Decrease the amount of pain medication if you aren?t having pain. ?Drink lots of decaffeinated fluids. ?Drink prune juice and/or each dried prunes ? ?If the first 3 don?t work start with additional solutions ?Take Colace - an over-the-counter stool softener ?Take Senokot - an over-the-counter laxative ?Take Miralax - a stronger over-the-counter laxative ? ? ?Dental Antibiotics: ? ?In most cases prophylactic antibiotics for Dental procdeures after total joint surgery are not necessary. ? ?Exceptions are as follows: ? ?1. History of prior total joint infection ? ?2. Severely immunocompromised (Organ Transplant, cancer chemotherapy, Rheumatoid biologic ?meds such as Brilliant) ? ?3. Poorly controlled diabetes (A1C &gt; 8.0, blood glucose over 200) ? ?If you have one of these conditions, contact your surgeon for an antibiotic prescription, prior to your ?dental procedure. ? ? ?For more information including helpful videos and documents visit our website:  ? ?[email or web address] ? ? ? ?

## 2021-10-21 NOTE — Anesthesia Postprocedure Evaluation (Signed)
Anesthesia Post Note ? ?Patient: Chad Levy ? ?Procedure(s) Performed: REVISION TOTAL SHOULDER TO REVERSE TOTAL SHOULDER (Right: Shoulder) ? ?  ? ?Patient location during evaluation: PACU ?Anesthesia Type: General ?Level of consciousness: awake and alert ?Pain management: pain level controlled ?Vital Signs Assessment: post-procedure vital signs reviewed and stable ?Respiratory status: spontaneous breathing, nonlabored ventilation and respiratory function stable ?Cardiovascular status: blood pressure returned to baseline ?Postop Assessment: no apparent nausea or vomiting ?Anesthetic complications: no ? ? ?No notable events documented. ? ?Last Vitals:  ?Vitals:  ? 10/21/21 1700 10/21/21 1715  ?BP: 125/78 112/76  ?Pulse: 80 70  ?Resp: 15 14  ?Temp: 36.6 ?C   ?SpO2: 93% 99%  ?  ?Last Pain:  ?Vitals:  ? 10/21/21 1700  ?TempSrc:   ?PainSc: 0-No pain  ? ? ?  ?  ?  ?  ?  ?  ? ?Marthenia Rolling ? ? ? ? ?

## 2021-10-21 NOTE — Op Note (Signed)
Orthopaedic Surgery Operative Note (CSN: [id number]) ? ?Chad Levy  [birth year] ?Date of Surgery: 10/21/2021 ? ? ?Diagnoses:  ?Right failed anatomic total shoulder arthroplasty ? ?Procedure: ?Right revision reverse total Shoulder Arthroplasty ?Right complete synovectomy ?  ?Operative Finding ?Successful completion of planned procedure.  Upon entering the joint the patient had irreparably torn the superior portion of the subscapularis and the remainder of it was nutritionally thinned and was not very mobile.  Once we removed the humeral head there was material that was concerning for purulence though there was no obvious pus within the shoulder.  We continued on took multiple samples that were sent for culture.  We treated the shoulder as a 1 stage arthroplasty.  We remove the entirety of all his remaining implants and converted to a reverse total shoulder arthroplasty.  The implants were removed without issue.  Axillary nerve tug test was unable to be performed due to subcoracoid scarring.  We stayed away from this area. ? ?While his implants seem reasonably well fixed the glenoid had significant bone loss that was uncontained in the anterior inferior quadrant.  The seem to be backside wear but the glenoid poly was relatively well fixed.  This also could have been from flexing of the glenoid polyethylene with subluxation of the humeral head anterior and superior.  His superior cuff was thin nutritionally and likely contributed to this.  That said we used a half wedge implant to try and fill this uncontained defect and were able to get good fixation. ? ?Post-operative plan: The patient will be NWB in sling.  The patient will be will be discharged from PACU if continues to be stable as was plan prior to surgery.  DVT prophylaxis Aspirin 81 mg twice daily for 6 weeks.  Pain control with PRN pain medication preferring oral medicines.  Will be maintained on oral doxycycline until his cultures are resulted at 3 weeks.   Follow up plan will be scheduled in approximately 7 days for incision check and XR.  Physical therapy to start after 4 weeks. ? ?Implants: Tornier size 3B long stem, +6 high offset tray with a 39+6 polyethylene, 39+3 glenosphere with a 25 half wedge augment turned to the 5 o'clock position.  40 center screw.  4 peripheral screws. ? ?Post-Op Diagnosis: Same ?Surgeons:Primary: Hiram Gash, MD ?Assistants:Caroline McBane PA-C ?Location: [facility] ROOM 06 ?Anesthesia: General with Exparel Interscalene ?Antibiotics: Ancef 2g preop, Vancomycin 1049m locally ?Tourniquet time: None ?Estimated Blood Loss: 100 ?Complications: None ?Specimens: 3 for culture, right shoulder 1 2 and 3, hold for 3 weeks to rule out C acnes ?Implants: ?Implant Name Type Inv. Item Serial No. Manufacturer Lot No. LRB No. Used Action  ?BASEPLATE AUGMNT [id number] - [id number] BASEPLATE AUGMNT [id number]  Right 1 Implanted  ?GLENOSPHERE PERFORM 39 3 - [id number] GLENOSPHERE PERFORM 39 3 [id number]  Right 1 Implanted  ?SCREW BONE 6.5X40 SM - [id number] SCREW BONE 6.5X40 SM  [employer]  Right 1 Implanted  ?SCREW 5.5X26 - [id number] SCREW 5.5X26  [employer]  Right 4 Implanted  ?TRAY REV FKarin Lieu3.5X6 - [id number] TRAY REV FLEX AEQUALIS 3.5X6 [id number]  Right 1 Implanted  ?STEM HUMERAL SZ 3B LONG 98MM - [id number] STEM HUMERAL SZ 3B LONG 98MM [id number]  Right 1 Implanted  ?INSERT REV KIT SHOULDER 6X39 - [id number] INSERT REV KIT SHOULDER 6X39 [id number]  Right 1 Implanted  ? ? ?  Indications for Surgery:   ?Chad Levy is a 61 y.o. male with previous total shoulder arthroplasty with no obvious abnormalities but concern for a cuff tear at a remote date.  Benefits and risks of operative and nonoperative management were discussed prior to surgery with patient/guardian(s) and informed consent form was completed.  Infection and  need for further surgery were discussed as was prosthetic stability and cuff issues.  We additionally specifically discussed risks of axillary nerve injury, infection, periprosthetic fracture, continued pain and longevity of implants prior to beginning procedure.   ? ? ? ?Procedure:   ?The patient was identified in the preoperative holding area where the surgical site was marked. Block placed by anesthesia with exparel.  The patient was taken to the OR where a procedural timeout was called and the above noted anesthesia was induced.  The patient was positioned beachchair on allen table with spider arm positioner.  Preoperative antibiotics were dosed.  The patient's right shoulder was prepped and draped in the usual sterile fashion.  A second preoperative timeout was called.     ? ?We began with deltopectoral approach using the previous incision.  We extended about 2 cm distally and 1 cm proximally.  With the skin sharply achieving hemostasis we progressed.  We identified the previously used deltopectoral interval and identified the cephalic vein which was protected and retracted laterally.  We open the deltopectoral interval and open the remainder of the clavipectoral fascia.  This point we placed a self-retaining Covell retractor. ? ?Once the retractor was placed we performed a subscapularis peel of the remaining subscapularis that had not attritional he torn as well as the capsule and the continuous tissue along the bicipital groove.  We elevated this from lateral to medial placing stay sutures for retraction purposes.  We used blunt retractors to try and aid in peeling the capsule directly off the bone.  We stayed away from the axillary nerve throughout this.  Axillary nerve tug test was unable to be performed due to subcoracoid scarring. ? ?Once the humeral head was exposed we used a tuning fork type device to remove the humeral head and noted that there is significant unhealthy appearing and possibly fibrinous  tissue underneath the humeral head.  Multiple specimens were taken and divided into 3 throughout the case.  These were to be held for 3 weeks to rule out C acnes. ? ?We performed extensive synovectomy of the entire joint starting at the humerus as we progressed we continue to take more samples. ? ?Once our humeral head was removed we used an osteotome to carefully extract the humeral stem.  The stem came out without issue but did appear to be well fixed.  We irrigated and debrided the humeral canal.  We open the canal in the pedestal at the tip of the stem using a drill.  Placed Irrisept solution per standard instructions and irrigated away per instruction. ? ?Once the humeral implants were removed we started to broach with the Tornier longstem flex implants.  We broached and countersink a size 3 stem and cut the humerus off of this.  We placed the humeral cut protector.  We turned our attention to the glenoid. ? ?Upon evaluation the glenoid on the surface appeared to be normal.  We cleared soft tissue on the periphery taking care to avoid the inferior aspect of the actually nerve lay.  We then fragmented the glenoid into 4 parts taking off 3 peripheral pegs and then unscrewing the center portion from  the ingrowth post.  We then used the Biomet system to extract the post using a coring reamer.  We performed a synovectomy of the glenoid and the peripheral tissue.  We noted that there is an uncontained defect involving the anterior inferior quadrant of the glenoid.  Based on this we felt that bone grafting in the setting of concern for infection was not a ideal option and instead used a half wedge implant to correct for this.  It may have retroverted our implant slightly however he felt that it was better to do this then to have an uncontained issue that may have led to loosening or Rocking Horse phenomenon rather than ingrowth. ? ?We reamed the flat portion of the glenoid and then reamed the defect which was about  the 4 to 5 o'clock position.  Once we had concentric reaming marks we open the vault and drilled for 6.5 x 40 center screw.  We placed a 25 mm half wedge implant.  We had good fixation with this.  4 perip

## 2021-10-21 NOTE — Interval H&P Note (Signed)
All questions answered, patient wants to proceed with procedure. ? ?

## 2021-10-21 NOTE — Transfer of Care (Signed)
Immediate Anesthesia Transfer of Care Note ? ?Patient: Chad Levy ? ?Procedure(s) Performed: REVISION TOTAL SHOULDER TO REVERSE TOTAL SHOULDER (Right: Shoulder) ? ?Patient Location: PACU ? ?Anesthesia Type:GA combined with regional for post-op pain ? ?Level of Consciousness: awake, alert , oriented, drowsy and patient cooperative ? ?Airway & Oxygen Therapy: Patient Spontanous Breathing and Patient connected to face mask oxygen ? ?Post-op Assessment: Report given to RN and Post -op Vital signs reviewed and stable ? ?Post vital signs: Reviewed and stable ? ?Last Vitals:  ?Vitals Value Taken Time  ?BP 125/78 10/21/21 1700  ?Temp 36.6 ?C 10/21/21 1700  ?Pulse 79 10/21/21 1703  ?Resp 12 10/21/21 1703  ?SpO2 97 % 10/21/21 1703  ?Vitals shown include unvalidated device data. ? ?Last Pain:  ?Vitals:  ? 10/21/21 1409  ?TempSrc:   ?PainSc: 0-No pain  ?   ? ?  ? ?Complications: No notable events documented. ?

## 2021-10-22 ENCOUNTER — Encounter (HOSPITAL_COMMUNITY): Payer: Self-pay | Admitting: Orthopaedic Surgery

## 2021-10-27 ENCOUNTER — Other Ambulatory Visit: Payer: Self-pay | Admitting: Internal Medicine

## 2021-10-27 DIAGNOSIS — T8450XA Infection and inflammatory reaction due to unspecified internal joint prosthesis, initial encounter: Secondary | ICD-10-CM

## 2021-10-27 LAB — AEROBIC/ANAEROBIC CULTURE W GRAM STAIN (SURGICAL/DEEP WOUND): Gram Stain: NONE SEEN

## 2021-10-27 MED ORDER — AMOXICILLIN 500 MG PO TABS
1000.0000 mg | ORAL_TABLET | Freq: Two times a day (BID) | ORAL | 0 refills | Status: DC
Start: 2021-10-27 — End: 2021-10-30

## 2021-10-27 NOTE — Progress Notes (Signed)
I was called by dr Griffin Basil regarding patient's recent + cultures for c.acnes. will plan to have him be seen in clinic in person (since he is [place] resident). We change abtx to amox 1gm TId for the time being until we see him. Will place picc line order to see if can get done on same day of visit ?

## 2021-10-28 ENCOUNTER — Telehealth: Payer: Self-pay

## 2021-10-28 NOTE — Telephone Encounter (Signed)
Patient scheduled with [facility] IR for picc line placement on 10/30/21 arrival time 8:45 am. Patient aware of appointment and directions to the appointment. Patient verbalized understanding. Caryl Pina with IR informed no first dose needed for the patient per [facility].  ?Alessandro Griep T Alaja Goldinger ? ?

## 2021-10-29 LAB — AEROBIC/ANAEROBIC CULTURE W GRAM STAIN (SURGICAL/DEEP WOUND)
Gram Stain: NONE SEEN
Gram Stain: NONE SEEN

## 2021-10-30 ENCOUNTER — Encounter: Payer: Self-pay | Admitting: Infectious Diseases

## 2021-10-30 ENCOUNTER — Telehealth: Payer: Self-pay

## 2021-10-30 ENCOUNTER — Ambulatory Visit (HOSPITAL_COMMUNITY)
Admission: RE | Admit: 2021-10-30 | Discharge: 2021-10-30 | Disposition: A | Discharge status: Home / Self Care | Payer: BC Managed Care – PPO | Source: Ambulatory Visit | Attending: Internal Medicine | Admitting: Internal Medicine

## 2021-10-30 ENCOUNTER — Other Ambulatory Visit: Payer: Self-pay

## 2021-10-30 ENCOUNTER — Ambulatory Visit (INDEPENDENT_AMBULATORY_CARE_PROVIDER_SITE_OTHER): Payer: BC Managed Care – PPO | Admitting: Infectious Diseases

## 2021-10-30 ENCOUNTER — Other Ambulatory Visit: Payer: Self-pay | Admitting: Internal Medicine

## 2021-10-30 DIAGNOSIS — T8450XA Infection and inflammatory reaction due to unspecified internal joint prosthesis, initial encounter: Secondary | ICD-10-CM

## 2021-10-30 DIAGNOSIS — Z7901 Long term (current) use of anticoagulants: Secondary | ICD-10-CM | POA: Insufficient documentation

## 2021-10-30 DIAGNOSIS — Z452 Encounter for adjustment and management of vascular access device: Secondary | ICD-10-CM

## 2021-10-30 DIAGNOSIS — Z96619 Presence of unspecified artificial shoulder joint: Secondary | ICD-10-CM

## 2021-10-30 DIAGNOSIS — Y839 Surgical procedure, unspecified as the cause of abnormal reaction of the patient, or of later complication, without mention of misadventure at the time of the procedure: Secondary | ICD-10-CM | POA: Diagnosis not present

## 2021-10-30 DIAGNOSIS — T8459XA Infection and inflammatory reaction due to other internal joint prosthesis, initial encounter: Secondary | ICD-10-CM

## 2021-10-30 MED ORDER — LIDOCAINE HCL 1 % IJ SOLN
INTRAMUSCULAR | Status: AC
  Administered 2021-10-30: 10 mL
  Filled 2021-10-30: qty 20

## 2021-10-30 MED ORDER — HEPARIN SOD (PORK) LOCK FLUSH 100 UNIT/ML IV SOLN
INTRAVENOUS | Status: AC
  Filled 2021-10-30: qty 5

## 2021-10-30 NOTE — Patient Instructions (Addendum)
Nice to meet you  ? ?Will plan 4 weeks of IV antibiotic called Ceftriaxone and then have you back to see if we are in a good place to switch back to oral antibiotics. You can come to see me and Dr. Baxter Flattery for this visit.  ? ?We will start the communication with home health to contact you about your care. They will help you with education about the line, how do to the injections and help with dressing care and lab work once a week.  ? ?If you continue to experience bleeding through the dressing please notify your home health - if you have not heard from them yet you can give [place] a call ? ?Continue the antibiotics by mouth until you give yourself the first injection of Ceftriaxone then can stop the antibiotic pills.  ? ? ?PICC Home Care Guide ?A peripherally inserted central catheter (PICC) is a form of IV access that allows medicines and IV fluids to be quickly put into the blood and spread throughout the body. The PICC is a long, thin, flexible tube (catheter) that is put into a vein in a person's arm or leg. The catheter ends in a large vein just outside the heart called the superior vena cava (SVC). After the PICC is put in, a chest X-ray may be done to make sure that it is in the right place. ?A PICC may be placed for different reasons, such as: ?To give medicines and liquid nutrition. ?To give IV fluids and blood products. ?To take blood samples often. ?If there is trouble placing a peripheral intravenous (PIV) catheter. ?If cared for properly, a PICC can remain in place for many months. Having a PICC can allow you to go home from the hospital sooner and continue treatment at home. Medicines and PICC care can be managed at home by a family member, caregiver, or home health care team. ?What are the risks? ?Generally, having a PICC is safe. However, problems may occur, including: ?A blood clot (thrombus) forming in or at the end of the PICC. ?A blood clot forming in a vein (deep vein thrombosis) or traveling to the  lung (pulmonary embolism). ?Inflammation of the vein (phlebitis) in which the PICC is placed. ?Infection at the insertion site or in the blood. Blood infections from central lines, like PICCs, can be serious and often require a hospital stay. ?PICC malposition, or PICC movement or poor placement. ?A break or cut in the PICC. Do not use scissors near the PICC. ?Nerve or tendon irritation or injury during PICC insertion. ?How to care for your PICC ?Please follow the specific guidelines provided by your health care provider. ?Preventing infection ?You and any caregivers should wash your hands often with soap and water for at least 20 seconds. Wash hands: ?Before touching the PICC or the infusion device. ?Before changing a bandage (dressing). Do not change the dressing unless you have been taught to do so and have shown you are able to change it safely. ?Flush the PICC as told. Tell your health care provider right away if the PICC is hard to flush or does not flush. Do not use force to flush the PICC. ?Use clean and germ-free (sterile) supplies only. Keep the supplies in a dry place. Do not reuse needles, syringes, or any other supplies. Reusing supplies can lead to infection. ?Keep the PICC dressing dry and secure it with tape if the edges stop sticking to your skin. ?Check your PICC insertion site every day for signs  of infection. Check for: ?Redness, swelling, or pain. ?Fluid or blood. ?Warmth. ?Pus or a bad smell. ?Preventing other problems ?Do not use a syringe that is less than 10 mL to flush the PICC. ?Do not have your blood pressure checked on the arm in which the PICC is placed. ?Do not ever pull or tug on the PICC. Keep it secured to your arm with tape or a stretch wrap when not in use. ?Do not take the PICC out yourself. Only a trained health care provider should remove the PICC. ?Keep pets and children away from your PICC. ?How to care for your PICC dressing ?Keep your PICC dressing clean and dry to prevent  infection. ?Do not take baths, swim, or use a hot tub until your health care provider approves. Ask your health care provider if you can take showers. You may only be allowed to take sponge baths. When you are allowed to shower: ?Ask your health care provider to teach you how to wrap the PICC. ?Cover the PICC with clear plastic wrap and tape to keep it dry while showering. ?Follow instructions from your health care provider about how to take care of your insertion site and dressing. Make sure you: ?Wash your hands with soap and water for at least 20 seconds before and after you change your dressing. If soap and water are not available, use hand sanitizer. ?Change your dressing only if taught to do so by your health care provider. Your PICC dressing needs to be changed if it becomes loose or wet. ?Leave stitches (sutures), skin glue, or adhesive strips in place. These skin closures may need to stay in place for 2 weeks or longer. If adhesive strip edges start to loosen and curl up, you may trim the loose edges. Do not remove adhesive strips completely unless your health care provider tells you to do that. ?Follow these instructions at home: ?Disposal of supplies ?Throw away any syringes in a disposal container that is meant for sharp items (sharps container). You can buy a sharps container from a pharmacy, or you can make one by using an empty, hard plastic bottle with a lid. ?Place any used dressings or infusion bags into a plastic bag. Throw that bag in the trash. ?General instructions ? ?Always carry your PICC identification card or wear a medical alert bracelet. ?Keep the tube clamped at all times, unless it is being used. ?Always carry a smooth-edge clamp with you to clamp the PICC if it breaks. ?Do not use scissors or sharp objects near the tube. ?You may bend your arm and move it freely. If your PICC is near or at the bend of your elbow, avoid activity with repeated motion at the elbow. ?Avoid lifting heavy  objects as told by your health care provider. ?Keep all follow-up visits. This is important. You will need to have your PICC dressing changed at least once a week. ?Contact a health care provider if: ?You have pain in your arm, ear, face, or teeth. ?You have a fever or chills. ?You have redness, swelling, or pain around the insertion site. ?You have fluid or blood coming from the insertion site. ?Your insertion site feels warm to the touch. ?You have pus or a bad smell coming from the insertion site. ?Your skin feels hard and raised around the insertion site. ?Your PICC dressing has gotten wet or is coming off and you have not been taught how to change it. ?Get help right away if: ?You have problems with  your PICC, such as your PICC: ?Was tugged or pulled and has partially come out. Do not  push the PICC back in. ?Cannot be flushed, is hard to flush, or leaks around the insertion site when it is flushed. ?Makes a flushing sound when it is flushed. ?Appears to have a hole or tear. ?Is accidentally pulled all the way out. If this happens, cover the insertion site with a gauze dressing. Do not throw the PICC away. Your health care provider will need to check it to be sure the entire catheter came out. ?You feel your heart racing or skipping beats, or you have chest pain. ?You have shortness of breath or trouble breathing. ?You have swelling, redness, warmth, or pain in the arm in which the PICC is placed. ?You have a red streak going up your arm that starts under the PICC dressing. ?These symptoms may be an emergency. Get help right away. Call 911. ?Do not wait to see if the symptoms will go away. ?Do not drive yourself to the hospital. ?Summary ?A peripherally inserted central catheter (PICC) is a long, thin, flexible tube (catheter) that is put into a vein in the arm or leg. ?If cared for properly, a PICC can remain in place for many months. Having a PICC can allow you to go home from the hospital sooner and continue  treatment at home. ?The PICC is inserted using a germ-free (sterile) technique by a specially trained health care provider. Only a trained health care provider should remove it. ?Do not have your bloo

## 2021-10-30 NOTE — Progress Notes (Signed)
? ?  ?Patient: Chad Levy  ?DOB: March 16, 1961 ?MRN: [id number] ?PCP: Chad Fear, DO  ?Referring Provider: Griffin Levy  ? ? ? ?Patient Active Problem List  ? Diagnosis Date Noted  ? Chronic anticoagulation 10/30/2021  ? PICC (peripherally inserted central catheter) in place 10/30/2021  ? Prosthetic shoulder infection, initial encounter ([place]) 08/19/2021  ? Chronic atrial fibrillation ([place]) 01/30/2021  ? Stage 2 chronic kidney disease 07/07/2020  ? Steatosis of liver 06/26/2020  ? Hypertriglyceridemia 03/04/2020  ? Deep venous thrombosis of lower extremity ([place]) 02/26/2020  ? Depressive disorder 02/26/2020  ? History of radical prostatectomy 02/26/2020  ? ED (erectile dysfunction) 07/15/2019  ? Primary malignant neoplasm of prostate with high risk of recurrence due to stage T3a, Gleason score of 8 to 10, and PSA greater than 20 ([place]) 07/15/2019  ?  ? ?Subjective:  ?Chad Levy is a 61 y.o. male with infection of total shoulder replacement.  ? ?CC: ?Just had picc placed and bleeding through. Brother joins him today and has experience with PICC line for his own care before.  ? ? ?HPI: ?Referred by Dr. 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 Primary implant was placed 2017 after injury during weight lifting (bench press). 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 Currently out of work.  ? ?Op Note Reviewed - irreparable tear of the superior subscapularis. After removing the humeral head there was material that was concerning for infection (no gross purulence but more chronic appearing infected material). Entire original shoulder system was removed without much difficulty. Extensive synovectomy of the joint was performed.  ?All 3 samples growing out c acnes. He has been on Amoxicillin 1gm TID since earlier this week awaiting  PICC line placement, which was arranged for earlier this morning.  ?No antibiotic allergies noted. Post op so far has gone very well. Anterior incision is healing nicely. He has had no fevers/chills and no problems with current oral antibiotics.  ? ?He is having some bleeding from the picc line placement - on chronic xarelto.  ? ?Review of Systems  ?Constitutional:  Negative for chills and fever.  ?Gastrointestinal:  Negative for diarrhea, nausea and vomiting.  ?Musculoskeletal:  Positive for joint pain (expected post op pain Rt shoulder).  ?Skin:  Negative for itching and rash.  ? ?Past Medical History:  ?Diagnosis Date  ? Chronic kidney disease   ? II  ? Depressive disorder   ? DVT (deep venous thrombosis) ([place])   ? Epididymitis   ? HLD (hyperlipidemia)   ? Prostate cancer ([place])   ? Pulmonary embolism ([place])   ? Steatosis of liver   ? ? ?Outpatient Medications Prior to Visit  ?Medication Sig Dispense Refill  ? acetaminophen (TYLENOL) 500 MG tablet Take 2 tablets (1,000 mg total) by mouth every 8 (eight) hours for 14 days. 84 tablet 0  ? diclofenac (VOLTAREN) 75 MG EC tablet Take 75 mg by mouth 2 (two) times daily.    ? fluvoxaMINE (LUVOX) 100 MG tablet Take 100 mg by mouth 2 (two) times daily.    ? rivaroxaban (XARELTO) 20 MG TABS tablet Take 20 mg by mouth daily.    ? amoxicillin (AMOXIL) 500 MG tablet Take 2 tablets (1,000 mg total) by mouth 2 (two) times daily. 40 tablet 0  ? ?No facility-administered medications prior to visit.  ?  ? ?  No Known Allergies ? ?Social History  ? ?Tobacco Use  ? Smoking status: Former  ?  Packs/day: 0.50  ?  Years: 38.00  ?  Pack years: 19.00  ?  Types: Cigarettes  ?  Quit date: 2020  ?  Years since quitting: 3.3  ? Smokeless tobacco: Current  ?  Types: Chew  ?Vaping Use  ? Vaping Use: Never used  ?Substance Use Topics  ? Alcohol use: Never  ? Drug use: Never  ? ? ?No family history on file. - brother had previous joint infections.  ? ?Objective:  ? ?Vitals:  ? 10/30/21 1120  ?Temp:  98.2 ?F (36.8 ?C)  ?TempSrc: Temporal  ?Weight: 233 lb (105.7 kg)  ? ?Body mass index is 32.5 kg/m?. ? ?Physical Exam ?Vitals reviewed.  ?Constitutional:   ?   Appearance: Normal appearance. He is not ill-appearing.  ?HENT:  ?   Head: Normocephalic.  ?   Mouth/Throat:  ?   Mouth: Mucous membranes are moist.  ?   Pharynx: Oropharynx is clear.  ?Eyes:  ?   General: No scleral icterus. ?Cardiovascular:  ?   Rate and Rhythm: Normal rate.  ?Pulmonary:  ?   Effort: Pulmonary effort is normal.  ?Musculoskeletal:     ?   General: Normal range of motion.  ?   Cervical back: Normal range of motion.  ?   Comments: Rt shoulder anterior incision with sutures in place. Swelling and healing ecchymosis but overall looks great. In sling.   ?Skin: ?   Coloration: Skin is not jaundiced or pale.  ?Neurological:  ?   Mental Status: He is alert and oriented to person, place, and time.  ?Psychiatric:     ?   Mood and Affect: Mood normal.     ?   Judgment: Judgment normal.  ? ? ?Lab Results: ?Lab Results  ?Component Value Date  ? WBC 5.4 10/15/2021  ? HGB 14.0 10/15/2021  ? HCT 42.6 10/15/2021  ? MCV 97.5 10/15/2021  ? PLT 244 10/15/2021  ?  ?Lab Results  ?Component Value Date  ? CREATININE 1.29 (H) 10/15/2021  ? BUN 27 (H) 10/15/2021  ? NA 140 10/15/2021  ? K 4.1 10/15/2021  ? CL 108 10/15/2021  ? CO2 24 10/15/2021  ? No results found for: ALT, AST, GGT, ALKPHOS, BILITOT  ? ?Assessment & Plan:  ? ?Problem List Items Addressed This Visit   ? ?  ? Unprioritized  ? PICC (peripherally inserted central catheter) in place  ?  Bleeding through gauze today after placement 3 hours ago. Not dripping through dressing at this time. Will change dressing prior to leaving so he has a fresh dressing and gauze on top to ensure no further bleeding.  ?Brief education discussed and demonstrated today. Further education with Home Health team.  ? ?Dressing change performed by Jinny Blossom, RN  ? ?  ?  ? Prosthetic shoulder infection, initial encounter ([place])  ?  Mr.  Liddy underwent conversion to reverse total shoulder for failed implant/tear with Dr. Griffin Levy April 26th. During the procedure there was concern for possible chronic infection around the humeral head that returned culture positive x 3 for Cutibacterium Acnes bacteria.  ?He underwent 1-stage exchange at this time as planned.  ? ?PICC line was placed today at IR here at [facility]. He will start IV ceftriaxone 2 gm once daily to complete 4-6 weeks of treatment with conversion to oral therapy to finish out 12 weeks. He tolerated cephalosporin during surgery recently  as part of periop abx care and does not need witnessed first dose.  ? ?We can try to time next appointment when he comes in to see Dr. Griffin Levy in 78m   ? ? ?OPAT ORDERS: ? ?Diagnosis: Shoulder PJI ? ?Culture Result: C acnes  ? ?No Known Allergies ? ? ?Discharge antibiotics to be given via PICC line: ? ?Ceftriaxone 2 gm IV Q24h  ? ? ?Duration: ?4 weeks  ? ? ?End Date: ?June 5th  ? ?[facility] with [facility] Use: ?Home health RN for IV administration and teaching, line care and labs.   ? ?Labs weekly while on IV antibiotics: ?_x_ CBC with differential ?__ BMP **TWICE WEEKLY ON VANCOMYCIN  ?_x_ CMP ?_x_ CRP ?__ ESR ?__ Vancomycin trough TWICE WEEKLY ?__ CK ? ?_x_ Please pull PIC at completion of IV antibiotics ?__ Please leave PIC in place until doctor has seen patient or been notified ? ?Fax weekly labs to ([phone]? ? ?Will have him back around 1 month to evaluate timing for transition to oral antibiotics for continued treatment. Baseline labs drawn today through PICC here in the office.  ? ?  ?  ? Relevant Orders  ? CBC  ? C-reactive protein  ? Basic metabolic panel  ? ? ?SJanene Madeira MSN, NP-C ?[facility]ious [facility] ?[facility] ?Pager: [phone]?Office: [phone]? ?10/30/21  ?11:47 AM ? ?

## 2021-10-30 NOTE — Assessment & Plan Note (Signed)
Bleeding through gauze today after placement 3 hours ago. Not dripping through dressing at this time. Will change dressing prior to leaving so he has a fresh dressing and gauze on top to ensure no further bleeding.  ?Brief education discussed and demonstrated today. Further education with Home Health team.  ? ?Dressing change performed by Jinny Blossom, RN  ?

## 2021-10-30 NOTE — Assessment & Plan Note (Addendum)
Mr. Gough underwent conversion to reverse total shoulder for failed implant/tear with Dr. Griffin Basil April 26th. During the procedure there was concern for possible chronic infection around the humeral head that returned culture positive x 3 for Cutibacterium Acnes bacteria.  ?He underwent 1-stage exchange at this time as planned.  ? ?PICC line was placed today at IR here at [facility]. He will start IV ceftriaxone 2 gm once daily to complete 4-6 weeks of treatment with conversion to oral therapy to finish out 12 weeks. He tolerated cephalosporin during surgery recently as part of periop abx care and does not need witnessed first dose.  ? ?We can try to time next appointment when he comes in to see Dr. Griffin Basil in 53m   ? ? ?OPAT ORDERS: ? ?Diagnosis: Shoulder PJI ? ?Culture Result: C acnes  ? ?No Known Allergies ? ? ?Discharge antibiotics to be given via PICC line: ? ?Ceftriaxone 2 gm IV Q24h  ? ? ?Duration: ?4 weeks  ? ? ?End Date: ?June 5th  ? ?[facility] with [facility] Use: ?Home health RN for IV administration and teaching, line care and labs.   ? ?Labs weekly while on IV antibiotics: ?_x_ CBC with differential ?__ BMP **TWICE WEEKLY ON VANCOMYCIN  ?_x_ CMP ?_x_ CRP ?__ ESR ?__ Vancomycin trough TWICE WEEKLY ?__ CK ? ?_x_ Please pull PIC at completion of IV antibiotics ?__ Please leave PIC in place until doctor has seen patient or been notified ? ?Fax weekly labs to ([phone]? ? ?Will have him back around 1 month to evaluate timing for transition to oral antibiotics for continued treatment. Baseline labs drawn today through PICC here in the office.  ? ?

## 2021-10-30 NOTE — Progress Notes (Signed)
Labs drawn via PICC line. Line flushed with normal saline and clamped.  ? ?Dressing was saturated with blood. Removed soiled PICC dressing and completed sterile dressing change with new StatLock and Biopatch. Patient tolerated well.  ? ?Beryle Flock, RN ? ?

## 2021-10-30 NOTE — Telephone Encounter (Signed)
I reached out to [facility] with [employer] and left a message to let her know that patient will need teaching for picc line placed today. Chad Levy will be reaching out to [employer] advising them what IV antibiotic patient will be starting.  ?[name] ? ?

## 2021-10-30 NOTE — Telephone Encounter (Signed)
Per [name], NP called [employer] and spoke with Vernie Shanks to notify [facility] that patient has opat orders in. Sent a community message as well. ?

## 2021-10-30 NOTE — Procedures (Signed)
Successful placement of single lumen PICC line to left basilic vein. ?Length 41cm ?Tip at [facility] ?PICC capped ?No complications ?Ready for use. ? ?EBL < 5 mL ? ? ?Alieyah Spader PA-C ?10/30/2021 ?10:31 AM ? ?  ?

## 2021-10-31 LAB — CBC
HCT: 35.2 % — ABNORMAL LOW (ref 38.5–50.0)
Hemoglobin: 12 g/dL — ABNORMAL LOW (ref 13.2–17.1)
MCH: 31 pg (ref 27.0–33.0)
MCHC: 34.1 g/dL (ref 32.0–36.0)
MCV: 91 fL (ref 80.0–100.0)
MPV: 10 fL (ref 7.5–12.5)
Platelets: 324 10*3/uL (ref 140–400)
RBC: 3.87 10*6/uL — ABNORMAL LOW (ref 4.20–5.80)
RDW: 12.7 % (ref 11.0–15.0)
WBC: 6.6 10*3/uL (ref 3.8–10.8)

## 2021-10-31 LAB — BASIC METABOLIC PANEL
BUN: 13 mg/dL (ref 7–25)
CO2: 24 mmol/L (ref 20–32)
Calcium: 9.2 mg/dL (ref 8.6–10.3)
Chloride: 104 mmol/L (ref 98–110)
Creat: 0.99 mg/dL (ref 0.70–1.35)
Glucose, Bld: 90 mg/dL (ref 65–99)
Potassium: 4.5 mmol/L (ref 3.5–5.3)
Sodium: 138 mmol/L (ref 135–146)

## 2021-10-31 LAB — C-REACTIVE PROTEIN: CRP: 43.1 mg/L — ABNORMAL HIGH (ref <8.0)

## 2021-11-05 ENCOUNTER — Telehealth: Payer: Self-pay

## 2021-11-05 NOTE — Telephone Encounter (Signed)
Patient notified front desk that he did not have PICC line dressing change supplies and wanted to know if it could be done in the office tomorrow.  ? ?Spoke with Lennette Bihari at [facility] in [place], he spoke with patient's home health nurse who is planning on going to patient's home tomorrow afternoon 11/06/21 to perform dressing change. Notified patient.  ? [facility]: [phone] ? ?Beryle Flock, RN ? ?

## 2021-11-25 DIAGNOSIS — Z792 Long term (current) use of antibiotics: Secondary | ICD-10-CM | POA: Insufficient documentation

## 2021-11-25 NOTE — Assessment & Plan Note (Signed)
All OPAT lab work reviewed x 2 weeks and all normal limits. Cr stable 1.06. Normalized CRP. No leukocytosis.

## 2021-11-25 NOTE — Progress Notes (Unsigned)
Patient: Chad Levy  DOB: May 09, 1961 MRN: [id number] PCP: Emelda Fear, DO  Referring Provider: Griffin Basil     Subjective:  Chad Levy is a 61 y.o. 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 Started on IV ceftriaxone 10/30/2021.    CC: Shoulder infection follow up - doing great!   HPI: Here for four week follow up today after starting IV ceftriaxone. He has had no problems with the picc line or antibiotics. Has not missed any doses and has no side effects. Due to complete antibiotics Monday June 5th.  Home health nursing experience has been good.  Saw Dr. Griffin Basil last week and was released from splint. Started physical therapy this week and has had some expected soreness after that but everything is great. No fevers/chills. Incision has healed over nicely.   PICC line is without pain, drainage or erythema and is well maintained by [facility] Team. No swelling or altered sensation in affected distal extremity.    Review of Systems  Constitutional:  Negative for chills and fever.  Gastrointestinal:  Negative for abdominal pain, diarrhea and vomiting.  Musculoskeletal:  Joint pain: expected shoulder soreness.  Skin:  Negative for rash.    Past Medical History:  Diagnosis Date   Chronic kidney disease    II   Depressive disorder    DVT (deep venous thrombosis) (HCC)    Epididymitis    HLD (hyperlipidemia)    Prostate cancer (HCC)    Pulmonary embolism (HCC)    Steatosis of liver     Outpatient Medications Prior to Visit  Medication Sig Dispense Refill   fluvoxaMINE (LUVOX) 100 MG tablet Take 100 mg by mouth 2 (two) times daily.     rivaroxaban (XARELTO) 20 MG TABS tablet Take 20 mg by mouth daily.     diclofenac (VOLTAREN) 75 MG EC tablet Take 75 mg by mouth 2 (two) times daily. (Patient not taking: Reported on 11/26/2021)     No facility-administered medications prior to visit.     No Known  Allergies  Social History   Tobacco Use   Smoking status: Former    Packs/day: 0.50    Years: 38.00    Pack years: 19.00    Types: Cigarettes    Quit date: 2020    Years since quitting: 3.4   Smokeless tobacco: Current    Types: Chew  Vaping Use   Vaping Use: Never used  Substance Use Topics   Alcohol use: Never   Drug use: Never    No family history on file. - brother had previous joint infections.   Objective:   Vitals:   11/26/21 0918  BP: 125/85  Pulse: 69  Temp: 97.6 F (36.4 C)  TempSrc: Oral  SpO2: 97%  Weight: 235 lb (106.6 kg)    Body mass index is 32.78 kg/m.  Physical Exam Vitals reviewed.  Constitutional:      Appearance: Normal appearance.  Cardiovascular:     Rate and Rhythm: Normal rate.  Abdominal:     General: There is no distension.     Palpations: Abdomen is soft.  Musculoskeletal:     Comments: Rt shoulder incision well healed. ROM returning, not 100% normal  Skin:    General: Skin is warm and dry.  Neurological:     Mental Status: He is alert and oriented to person, place, and time.  LUE PICC line - clean/dry dressing. Insertion site w/o erythema, tenderness, drainage, cording or  distal swelling of affected extremity    Lab Results: Lab Results  Component Value Date   WBC 6.6 10/30/2021   HGB 12.0 (L) 10/30/2021   HCT 35.2 (L) 10/30/2021   MCV 91.0 10/30/2021   PLT 324 10/30/2021    Lab Results  Component Value Date   CREATININE 0.99 10/30/2021   BUN 13 10/30/2021   NA 138 10/30/2021   K 4.5 10/30/2021   CL 104 10/30/2021   CO2 24 10/30/2021   No results found for: ALT, AST, GGT, ALKPHOS, BILITOT   Assessment & Plan:   Problem List Items Addressed This Visit       Unprioritized   PICC (peripherally inserted central catheter) in place    Site unremarkable, well maintained and functioning as expected. Continue care and maintenance until planned end of IV antibiotics. Home Health team to remove at completion.          Long term (current) use of antibiotics    All OPAT lab work reviewed x 2 weeks and all normal limits. Cr stable 1.06. Normalized CRP. No leukocytosis.          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 Doing well after 4 weeks of IV ceftriaxone. Will remove PICC and start on oral therapy with doxycycline 100 mg BID for ongoing treatment to try to get him through 12 weeks of treatment total.   CRP normalized 2 weeks into treatment and most recently measured as 1 mg/dL.   Will see him back closer to the end of his therapy in 6-8 weeks.         [name], MSN, NP-C [facility] [place] Pager: [phone] Office: [phone]  11/26/21  10:06 AM

## 2021-11-25 NOTE — Assessment & Plan Note (Signed)
Site unremarkable, well maintained and functioning as expected. Continue care and maintenance until planned end of IV antibiotics. Home Health team to remove at completion.   

## 2021-11-25 NOTE — Assessment & Plan Note (Signed)
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 Doing well after 4 weeks of IV ceftriaxone. Will remove PICC and start on oral therapy with doxycycline 100 mg BID for ongoing treatment to try to get him through 12 weeks of treatment total.   CRP normalized 2 weeks into treatment and most recently measured as 1 mg/dL.   Will see him back closer to the end of his therapy in 6-8 weeks.

## 2021-11-26 ENCOUNTER — Other Ambulatory Visit: Payer: Self-pay

## 2021-11-26 ENCOUNTER — Telehealth: Payer: Self-pay

## 2021-11-26 ENCOUNTER — Encounter: Payer: Self-pay | Admitting: Infectious Diseases

## 2021-11-26 ENCOUNTER — Ambulatory Visit (INDEPENDENT_AMBULATORY_CARE_PROVIDER_SITE_OTHER): Payer: BC Managed Care – PPO | Admitting: Infectious Diseases

## 2021-11-26 DIAGNOSIS — T8453XD Infection and inflammatory reaction due to internal right knee prosthesis, subsequent encounter: Secondary | ICD-10-CM | POA: Diagnosis not present

## 2021-11-26 DIAGNOSIS — Z452 Encounter for adjustment and management of vascular access device: Secondary | ICD-10-CM

## 2021-11-26 DIAGNOSIS — Z792 Long term (current) use of antibiotics: Secondary | ICD-10-CM

## 2021-11-26 DIAGNOSIS — Z96619 Presence of unspecified artificial shoulder joint: Secondary | ICD-10-CM

## 2021-11-26 MED ORDER — DOXYCYCLINE HYCLATE 100 MG PO TABS: 100.0000 mg | ORAL_TABLET | Freq: Two times a day (BID) | ORAL | 1 refills | Status: AC

## 2021-11-26 NOTE — Telephone Encounter (Signed)
Per [name], NP, okay to pull PICC after last dose on 11/30/21.   Shared orders with Carolynn Sayers, RN at [facility] and updated [facility] pharmacy team.   Beryle Flock, RN

## 2021-11-26 NOTE — Patient Instructions (Signed)
Nice to see you are doing well!   Your labs look great - all the inflammation has resolved with treatment and recovery from surgery.  Great time to switch to pills for 2 more months to finish out your treatment.   Will see you back in 2 months - can call back to coordinate with your orthopedic visit with Dr. Griffin Basil to see either me or Dr. Baxter Flattery (she is familiar with you too).   START Doxycycline on June 6th   Take twice a day with food (breakfast and dinner) Make sure you drink a full 8 oz glass of water with each dose Sit upright for 1 hour after to prevent heartburn Careful in the sun - can make you more at risk for sun burns (bad ones...) Limit sun exposure between 10-4 Sun block! Don't forget to reapply it Hats Longer sleeves/pants

## 2021-11-27 ENCOUNTER — Ambulatory Visit: Payer: BC Managed Care – PPO | Admitting: Infectious Diseases

## 2022-01-22 ENCOUNTER — Other Ambulatory Visit: Payer: Self-pay

## 2022-01-22 ENCOUNTER — Other Ambulatory Visit: Payer: BC Managed Care – PPO

## 2022-01-22 DIAGNOSIS — Z96619 Presence of unspecified artificial shoulder joint: Secondary | ICD-10-CM

## 2022-01-23 LAB — CBC WITH DIFFERENTIAL/PLATELET
Absolute Monocytes: 605 cells/uL (ref 200–950)
Basophils Absolute: 28 cells/uL (ref 0–200)
Basophils Relative: 0.5 %
Eosinophils Absolute: 78 cells/uL (ref 15–500)
Eosinophils Relative: 1.4 %
HCT: 44.7 % (ref 38.5–50.0)
Hemoglobin: 15.1 g/dL (ref 13.2–17.1)
Lymphs Abs: 1910 cells/uL (ref 850–3900)
MCH: 32 pg (ref 27.0–33.0)
MCHC: 33.8 g/dL (ref 32.0–36.0)
MCV: 94.7 fL (ref 80.0–100.0)
MPV: 11 fL (ref 7.5–12.5)
Monocytes Relative: 10.8 %
Neutro Abs: 2979 cells/uL (ref 1500–7800)
Neutrophils Relative %: 53.2 %
Platelets: 209 10*3/uL (ref 140–400)
RBC: 4.72 10*6/uL (ref 4.20–5.80)
RDW: 13.2 % (ref 11.0–15.0)
Total Lymphocyte: 34.1 %
WBC: 5.6 10*3/uL (ref 3.8–10.8)

## 2022-01-23 LAB — C-REACTIVE PROTEIN: CRP: 2.1 mg/L (ref <8.0)

## 2022-01-23 LAB — SEDIMENTATION RATE: Sed Rate: 2 mm/h (ref 0–20)

## 2022-01-25 NOTE — Progress Notes (Signed)
Please call Chad Levy to let him know his labs are all back to normal. This is an excellent sign that his infection has been well treated after the surgery and antibiotics.   I want him to finish out the Doxycycline antibiotic until they are gone like we previously discussed and I look forward to seeing him at his next appointment.

## 2022-01-25 NOTE — Progress Notes (Signed)
Left HIPPA compliant vm asking patient to call providers office.

## 2022-02-05 ENCOUNTER — Ambulatory Visit: Payer: BC Managed Care – PPO | Admitting: Infectious Diseases

## 2022-02-05 ENCOUNTER — Encounter: Payer: Self-pay | Admitting: Infectious Diseases

## 2022-02-05 ENCOUNTER — Other Ambulatory Visit: Payer: Self-pay

## 2022-02-05 DIAGNOSIS — Z792 Long term (current) use of antibiotics: Secondary | ICD-10-CM

## 2022-02-05 DIAGNOSIS — T8459XA Infection and inflammatory reaction due to other internal joint prosthesis, initial encounter: Secondary | ICD-10-CM | POA: Diagnosis not present

## 2022-02-05 DIAGNOSIS — Z96611 Presence of right artificial shoulder joint: Secondary | ICD-10-CM | POA: Diagnosis not present

## 2022-02-05 NOTE — Assessment & Plan Note (Signed)
Labs 2 weeks prior reviewed - has normal CBC, normal CRP/ESR normalized.

## 2022-02-05 NOTE — Progress Notes (Unsigned)
Patient: Chad Levy  DOB: [birth year] MRN: [id number] PCP: Emelda Fear, DO  Referring Provider: Griffin Basil     Subjective:  Chad Levy is a 61 y.o. 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 Started on IV ceftriaxone 10/30/2021.    CC: Right PJI follow up - no complaints or concerns today. Released from restrictions from Dr. Griffin Basil.     HPI: Completed his course of Doxycycline following IV Ceftriaxone to complete recommended 12 weeks of treatment following 1 stage reverse total shoulder.   He has been doing great and lifted of all restrictions following surgery. He did well with his course of antibiotics and did not notice any side effects during the course of treatment.    Review of Systems  Constitutional:  Negative for chills and fever.  Gastrointestinal:  Negative for abdominal pain, diarrhea and vomiting.  Musculoskeletal:  Joint pain: expected shoulder soreness.  Skin:  Negative for rash.     Past Medical History:  Diagnosis Date   Chronic kidney disease    II   Depressive disorder    DVT (deep venous thrombosis) (HCC)    Epididymitis    HLD (hyperlipidemia)    Prostate cancer (HCC)    Pulmonary embolism (HCC)    Steatosis of liver     Outpatient Medications Prior to Visit  Medication Sig Dispense Refill   fluvoxaMINE (LUVOX) 100 MG tablet Take 100 mg by mouth 2 (two) times daily.     rivaroxaban (XARELTO) 20 MG TABS tablet Take 20 mg by mouth daily.     diclofenac (VOLTAREN) 75 MG EC tablet Take 75 mg by mouth 2 (two) times daily. (Patient not taking: Reported on 11/26/2021)     No facility-administered medications prior to visit.     No Known Allergies  Social History   Tobacco Use   Smoking status: Former    Packs/day: 0.50    Years: 38.00    Total pack years: 19.00    Types: Cigarettes    Quit date: 2020    Years since quitting: 3.6   Smokeless tobacco: Current    Types: Chew   Vaping Use   Vaping Use: Never used  Substance Use Topics   Alcohol use: Never   Drug use: Never     Objective:   Vitals:   02/05/22 0901  BP: 120/84  Pulse: 68  Resp: 16  Temp: 97.9 F (36.6 C)  TempSrc: Oral  SpO2: 97%  Weight: 233 lb (105.7 kg)  Height: '5\' 11"'$  (1.803 m)    Body mass index is 32.5 kg/m.   Physical Exam Vitals reviewed.  Constitutional:      Appearance: Normal appearance.  Cardiovascular:     Rate and Rhythm: Normal rate.  Abdominal:     General: There is no distension.     Palpations: Abdomen is soft.  Musculoskeletal:     Comments: Rt shoulder incision well healed. No pain/restrictions.  Skin:    General: Skin is warm and dry.  Neurological:     Mental Status: He is alert and oriented to person, place, and time.      Lab Results: Lab Results  Component Value Date   WBC 5.6 01/22/2022   HGB 15.1 01/22/2022   HCT 44.7 01/22/2022   MCV 94.7 01/22/2022   PLT 209 01/22/2022    Lab Results  Component Value Date   CREATININE 0.99 10/30/2021   BUN 13 10/30/2021   NA 138  10/30/2021   K 4.5 10/30/2021   CL 104 10/30/2021   CO2 24 10/30/2021   No results found for: "ALT", "AST", "GGT", "ALKPHOS", "BILITOT"   Assessment & Plan:   Problem List Items Addressed This Visit   None  [name], MSN, NP-C [place] [facility] [place] Pager: [phone] Office: [phone]  02/05/22  9:21 AM

## 2022-02-05 NOTE — Assessment & Plan Note (Signed)
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  Completed 4 weeks IV ceftriaxone >> 8 weeks PO doxycycline.

## 2022-04-15 ENCOUNTER — Other Ambulatory Visit: Payer: Self-pay

## 2022-04-15 ENCOUNTER — Ambulatory Visit (INDEPENDENT_AMBULATORY_CARE_PROVIDER_SITE_OTHER): Payer: BC Managed Care – PPO | Admitting: Infectious Diseases

## 2022-04-15 DIAGNOSIS — Z96619 Presence of unspecified artificial shoulder joint: Secondary | ICD-10-CM | POA: Diagnosis not present

## 2022-04-15 DIAGNOSIS — T8459XA Infection and inflammatory reaction due to other internal joint prosthesis, initial encounter: Secondary | ICD-10-CM | POA: Diagnosis not present

## 2022-04-15 NOTE — Assessment & Plan Note (Signed)
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  Completed 4 weeks of IV ceftriaxone with 8 weeks of p.o. doxycycline completed in late July 2023.  He joins me for 31-monthfollow-up off antibiotics and continues to do exceptionally well.  He has had no regression in symptoms or shoulder pathology. He has a follow-up visit coming up with Dr. VGriffin Basilsoon for a 69-monthostop check.  I will plan to see Je[place]bertack in April 2024 via telephone.  Welcome to come in person if there is any concern or if Dr. VaGriffin Basilould like him to be seen in person.  During treatment all of his inflammatory markers completely normalized which is very reassuring we have achieved good cure here.  He will call me if he has any trouble between visits pertaining to his shoulder.

## 2022-04-15 NOTE — Progress Notes (Signed)
Virtual Visit via Telephone Note  I connected with [name] on 04/15/22 at  9:30 AM EDT by telephone and verified that I am speaking with the correct person using two identifiers.  Location: Patient: Chad Levy residence Provider: [place] [facility]   I discussed the limitations, risks, security and privacy concerns of performing an evaluation and management service by telephone and the availability of in person appointments. I also discussed with the patient that there may be a patient responsible charge related to this service. The patient expressed understanding and agreed to proceed.   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  October 21, 2021 he underwent revision of the right total shoulder, stage I reverse total shoulder. 3 of 3 intraoperative cultures grew back rare to few cuneiform acne.  Was treated with 4 weeks of IV ceftriaxone and transition to doxycycline to complete a total of 12-weeks of treatment.   Chad Levy has been doing well since her last office visit together 2 months ago.  Over this time he has not noticed any regression to the pain or quality of range of motion in his right shoulder.  Incision remains well-healed.  He is enjoying riding his motorcycle and plans to take it out for autumn trip. Has one more orthopedic follow-up coming up soon and then 1 more 1 year postop.     Observations/Objective: Pleasant.  Sounds to be in good health.   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  Completed 4 weeks of IV ceftriaxone with 8 weeks of p.o. doxycycline completed in late July 2023.  He joins me for 73-monthfollow-up off antibiotics and continues to do exceptionally  well.  He has had no regression in symptoms or shoulder pathology. He has a follow-up visit coming up with Dr. VGriffin Basilsoon for a 671-monthostop check.  I will plan to see Chad Levy in April 2024 via telephone.  Welcome to come in person if there is any concern or if Dr. VaGriffin Basilould like him to be seen in person.  During treatment all of his inflammatory markers completely normalized which is very reassuring we have achieved good cure here.  He will call me if he has any trouble between visits pertaining to his shoulder.         Follow Up Instructions: As above    I discussed the assessment and treatment plan with the patient. The patient was provided an opportunity to ask questions and all were answered. The patient agreed with the plan and demonstrated an understanding of the instructions.   The patient was advised to call back or seek an in-person evaluation if the symptoms worsen or if the condition fails to improve as anticipated.  I provided 15 minutes of non-face-to-face time during this encounter including chart review related to current problem.    StJanene MadeiraNP

## 2022-07-15 NOTE — Progress Notes (Signed)
Surgery orders requested via Epic inbox. °

## 2022-07-21 NOTE — Patient Instructions (Signed)
DUE TO COVID-19 ONLY TWO VISITORS  (aged 62 and older)  ARE ALLOWED TO COME WITH YOU AND STAY IN THE WAITING ROOM ONLY DURING PRE OP AND PROCEDURE.   **NO VISITORS ARE ALLOWED IN THE SHORT STAY AREA OR RECOVERY ROOM!!**  IF YOU WILL BE ADMITTED INTO THE HOSPITAL YOU ARE ALLOWED ONLY FOUR SUPPORT PEOPLE DURING VISITATION HOURS ONLY (7 AM -8PM)   The support person(s) must pass our screening, gel in and out, and wear a mask at all times, including in the patient's room. Patients must also wear a mask when staff or their support person are in the room. Visitors GUEST BADGE MUST BE WORN VISIBLY  One adult visitor may remain with you overnight and MUST be in the room by 8 P.M.     Your procedure is scheduled on: 08/03/22   Report to [facility] Main Entrance    Report to admitting at : 5:15 AM   Call this number if you have problems the morning of surgery [phone]   Do not eat food :After Midnight.   After Midnight you may have the following liquids until : 4:30 AM DAY OF SURGERY  Water Black Coffee (sugar ok, NO MILK/CREAM OR CREAMERS)  Tea (sugar ok, NO MILK/CREAM OR CREAMERS) regular and decaf                             Plain Jell-O (NO RED)                                           Fruit ices (not with fruit pulp, NO RED)                                     Popsicles (NO RED)                                                                  Juice: apple, WHITE grape, WHITE cranberry Sports drinks like Gatorade (NO RED)   The day of surgery:  Drink ONE (1) Pre-Surgery Clear Ensure or G2 at: 4:30 AM the morning of surgery. Drink in one sitting. Do not sip.  This drink was given to you during your hospital  pre-op appointment visit. Nothing else to drink after completing the  Pre-Surgery Clear Ensure or G2.          If you have questions, please contact your surgeon's office.  Oral Hygiene is also important to reduce your risk of infection.                                     Remember - BRUSH YOUR TEETH THE MORNING OF SURGERY WITH YOUR REGULAR TOOTHPASTE  DENTURES WILL BE REMOVED PRIOR TO SURGERY PLEASE DO NOT APPLY "Poly grip" OR ADHESIVES!!!   Do NOT smoke after Midnight   Take these medicines the morning of surgery with A SIP OF WATER: fluvoxamine,dicyclomide,donepezil.  You may not have any metal on your body including hair pins, jewelry, and body piercing             Do not wear lotions, powders, perfumes/cologne, or deodorant              Men may shave face and neck.   Do not bring valuables to the hospital. [place].   Contacts, glasses, or bridgework may not be worn into surgery.   Bring small overnight bag day of surgery.   DO NOT [place]. PHARMACY WILL DISPENSE MEDICATIONS LISTED ON YOUR MEDICATION LIST TO YOU DURING YOUR ADMISSION Page!    Patients discharged on the day of surgery will not be allowed to drive home.  Someone NEEDS to stay with you for the first 24 hours after anesthesia.   Special Instructions: Bring a copy of your healthcare power of attorney and living will documents         the day of surgery if you haven't scanned them before.              Please read over the following fact sheets you were given: IF YOU HAVE QUESTIONS ABOUT YOUR PRE-OP INSTRUCTIONS PLEASE CALL [phone]    [facility] - Preparing for Surgery Before surgery, you can play an important role.  Because skin is not sterile, your skin needs to be as free of germs as possible.  You can reduce the number of germs on your skin by washing with CHG (chlorahexidine gluconate) soap before surgery.  CHG is an antiseptic cleaner which kills germs and bonds with the skin to continue killing germs even after washing. Please DO NOT use if you have an allergy to CHG or antibacterial soaps.  If your skin becomes reddened/irritated stop using the  CHG and inform your nurse when you arrive at Short Stay. Do not shave (including legs and underarms) for at least 48 hours prior to the first CHG shower.  You may shave your face/neck. Please follow these instructions carefully:  1.  Shower with CHG Soap the night before surgery and the  morning of Surgery.  2.  If you choose to wash your hair, wash your hair first as usual with your  normal  shampoo.  3.  After you shampoo, rinse your hair and body thoroughly to remove the  shampoo.                           4.  Use CHG as you would any other liquid soap.  You can apply chg directly  to the skin and wash                       Gently with a scrungie or clean washcloth.  5.  Apply the CHG Soap to your body ONLY FROM THE NECK DOWN.   Do not use on face/ open                           Wound or open sores. Avoid contact with eyes, ears mouth and genitals (private parts).                       Wash face,  Genitals (private parts) with your  normal soap.             6.  Wash thoroughly, paying special attention to the area where your surgery  will be performed.  7.  Thoroughly rinse your body with warm water from the neck down.  8.  DO NOT shower/wash with your normal soap after using and rinsing off  the CHG Soap.                9.  Pat yourself dry with a clean towel.            10.  Wear clean pajamas.            11.  Place clean sheets on your bed the night of your first shower and do not  sleep with pets. Day of Surgery : Do not apply any lotions/deodorants the morning of surgery.  Please wear clean clothes to the hospital/surgery center.  FAILURE TO FOLLOW THESE INSTRUCTIONS MAY RESULT IN THE CANCELLATION OF YOUR SURGERY PATIENT SIGNATURE_________________________________  NURSE SIGNATURE__________________________________  ________________________________________________________________________  Chad Levy  An incentive spirometer is a tool that can help keep your lungs clear and  active. This tool measures how well you are filling your lungs with each breath. Taking long deep breaths may help reverse or decrease the chance of developing breathing (pulmonary) problems (especially infection) following: A long period of time when you are unable to move or be active. BEFORE THE PROCEDURE  If the spirometer includes an indicator to show your best effort, your nurse or respiratory therapist will set it to a desired goal. If possible, sit up straight or lean slightly forward. Try not to slouch. Hold the incentive spirometer in an upright position. INSTRUCTIONS FOR USE  Sit on the edge of your bed if possible, or sit up as far as you can in bed or on a chair. Hold the incentive spirometer in an upright position. Breathe out normally. Place the mouthpiece in your mouth and seal your lips tightly around it. Breathe in slowly and as deeply as possible, raising the piston or the ball toward the top of the column. Hold your breath for 3-5 seconds or for as long as possible. Allow the piston or ball to fall to the bottom of the column. Remove the mouthpiece from your mouth and breathe out normally. Rest for a few seconds and repeat Steps 1 through 7 at least 10 times every 1-2 hours when you are awake. Take your time and take a few normal breaths between deep breaths. The spirometer may include an indicator to show your best effort. Use the indicator as a goal to work toward during each repetition. After each set of 10 deep breaths, practice coughing to be sure your lungs are clear. If you have an incision (the cut made at the time of surgery), support your incision when coughing by placing a pillow or rolled up towels firmly against it. Once you are able to get out of bed, walk around indoors and cough well. You may stop using the incentive spirometer when instructed by your caregiver.  RISKS AND COMPLICATIONS Take your time so you do not get dizzy or light-headed. If you are in pain,  you may need to take or ask for pain medication before doing incentive spirometry. It is harder to take a deep breath if you are having pain. AFTER USE Rest and breathe slowly and easily. It can be helpful to keep track of a log of your progress. Your caregiver can  provide you with a simple table to help with this. If you are using the spirometer at home, follow these instructions: [place] IF:  You are having difficultly using the spirometer. You have trouble using the spirometer as often as instructed. Your pain medication is not giving enough relief while using the spirometer. You develop fever of 100.5 F (38.1 C) or higher. SEEK IMMEDIATE MEDICAL CARE IF:  You cough up bloody sputum that had not been present before. You develop fever of 102 F (38.9 C) or greater. You develop worsening pain at or near the incision site. MAKE SURE YOU:  Understand these instructions. Will watch your condition. Will get help right away if you are not doing well or get worse. Document Released: 10/25/2006 Document Revised: 09/06/2011 Document Reviewed: 12/26/2006 [facility] Patient Information 2014 [place], [place].   ________________________________________________________________________

## 2022-07-22 ENCOUNTER — Encounter (HOSPITAL_COMMUNITY)
Admission: RE | Admit: 2022-07-22 | Discharge: 2022-07-22 | Disposition: A | Discharge status: Home / Self Care | Payer: BC Managed Care – PPO | Source: Ambulatory Visit | Attending: Orthopedic Surgery | Admitting: Orthopedic Surgery

## 2022-07-22 ENCOUNTER — Encounter (HOSPITAL_COMMUNITY): Payer: Self-pay

## 2022-07-22 ENCOUNTER — Other Ambulatory Visit: Payer: Self-pay

## 2022-07-22 VITALS — BP 126/81 | HR 71 | Temp 97.6°F | Ht 71.0 in | Wt 230.0 lb

## 2022-07-22 DIAGNOSIS — Z01818 Encounter for other preprocedural examination: Secondary | ICD-10-CM | POA: Diagnosis not present

## 2022-07-22 DIAGNOSIS — I482 Chronic atrial fibrillation, unspecified: Secondary | ICD-10-CM | POA: Diagnosis not present

## 2022-07-22 HISTORY — DX: Peripheral vascular disease, unspecified: I73.9

## 2022-07-22 HISTORY — DX: Unspecified osteoarthritis, unspecified site: M19.90

## 2022-07-22 LAB — BASIC METABOLIC PANEL
Anion gap: 9 (ref 5–15)
BUN: 14 mg/dL (ref 8–23)
CO2: 24 mmol/L (ref 22–32)
Calcium: 9 mg/dL (ref 8.9–10.3)
Chloride: 105 mmol/L (ref 98–111)
Creatinine, Ser: 1.03 mg/dL (ref 0.61–1.24)
GFR, Estimated: >60 mL/min (ref >60)
Glucose, Bld: 85 mg/dL (ref 70–99)
Potassium: 4.2 mmol/L (ref 3.5–5.1)
Sodium: 138 mmol/L (ref 135–145)

## 2022-07-22 LAB — CBC
HCT: 45.3 % (ref 39.0–52.0)
Hemoglobin: 15.3 g/dL (ref 13.0–17.0)
MCH: 32.1 pg (ref 26.0–34.0)
MCHC: 33.8 g/dL (ref 30.0–36.0)
MCV: 95.2 fL (ref 80.0–100.0)
Platelets: 208 10*3/uL (ref 150–400)
RBC: 4.76 MIL/uL (ref 4.22–5.81)
RDW: 12.9 % (ref 11.5–15.5)
WBC: 6.4 10*3/uL (ref 4.0–10.5)
nRBC: 0 % (ref 0.0–0.2)

## 2022-07-22 LAB — SURGICAL PCR SCREEN
MRSA, PCR: NEGATIVE
Staphylococcus aureus: NEGATIVE

## 2022-07-22 NOTE — Progress Notes (Signed)
For Short Stay: [place] appointment date:  Bowel Prep reminder:   For Anesthesia: PCP - DO: Ferdinand Lango. Clearance: Lamarr Lulas: 07/06/22: Chart. Cardiologist - N/A  Chest x-ray -  EKG -  Stress Test -  ECHO -  Cardiac Cath -  Pacemaker/ICD device last checked: Pacemaker orders received: Device Rep notified:  Spinal Cord Stimulator:  Sleep Study - N/A CPAP -   Fasting Blood Sugar -  Checks Blood Sugar _____ times a day Date and result of last Hgb A1c-  Last dose of GLP1 agonist-  GLP1 instructions:   Last dose of SGLT-2 inhibitors-  SGLT-2 instructions:   Blood Thinner Instructions: Xarelto: No instructions yet.Pt. was told that they will give him instructions before surgery,from surgeon's office. Aspirin Instructions: Last Dose:  Activity level: Can go up a flight of stairs and activities of daily living without stopping and without chest pain and/or shortness of breath   Able to exercise without chest pain and/or shortness of breath  Anesthesia review: Hx: DVT,PE,CKD II,Smoker  Patient denies shortness of breath, fever, cough and chest pain at PAT appointment   Patient verbalized understanding of instructions that were given to them at the PAT appointment. Patient was also instructed that they will need to review over the PAT instructions again at home before surgery.

## 2022-07-29 NOTE — Progress Notes (Signed)
Anesthesia Chart Review  Case: [id number] Date/Time: 08/03/22 0715   Procedure: UNICOMPARTMENTAL KNEE (Left: Knee)   Anesthesia type: Choice   Pre-op diagnosis: djd left knee   Location: [facility]OM 07 / [facility]   Surgeons: Marchia Bond, MD       DISCUSSION:61 y.o. smoker with h/o PE, DVT, CKD Stage II, prostate cancer, left knee djd scheduled for above procedure 08/03/2022 with Dr. Marchia Bond.   Pt reports last dose of Xarelto morning of 07/30/2022.    VS: BP 126/81   Pulse 71   Temp 36.4 C (Oral)   Ht '5\' 11"'$  (1.803 m)   Wt 104.3 kg   SpO2 97%   BMI 32.08 kg/m   PROVIDERS: Emelda Fear, DO is PCP    LABS: Labs reviewed: Acceptable for surgery. (all labs ordered are listed, but only abnormal results are displayed)  Labs Reviewed  SURGICAL PCR SCREEN  BASIC METABOLIC PANEL  CBC     IMAGES:   EKG:   CV:  Past Medical History:  Diagnosis Date   Arthritis    Chronic kidney disease    II   Depressive disorder    DVT (deep venous thrombosis) (HCC)    Epididymitis    HLD (hyperlipidemia)    Peripheral vascular disease (HCC)    Prostate cancer (HCC)    Pulmonary embolism (HCC)    Steatosis of liver     Past Surgical History:  Procedure Laterality Date   KNEE ARTHROSCOPY  1995   meniscus removal   KNEE ARTHROSCOPY Left 1985   MEDIAL PARTIAL KNEE REPLACEMENT Right 2019   PROSTATECTOMY  2020   REVISION TOTAL SHOULDER TO REVERSE TOTAL SHOULDER Right 10/21/2021   Procedure: REVISION TOTAL SHOULDER TO REVERSE TOTAL SHOULDER;  Surgeon: Hiram Gash, MD;  Location: [facility];  Service: Orthopedics;  Laterality: Right;   SHOULDER ARTHROSCOPY W/ ROTATOR CUFF REPAIR Right 2001   TOTAL SHOULDER REPLACEMENT Right 2015    MEDICATIONS:  acetaminophen (TYLENOL) 500 MG tablet   dicyclomine (BENTYL) 20 MG tablet   donepezil (ARICEPT) 5 MG tablet   fenofibrate 54 MG tablet   fluvoxaMINE (LUVOX) 100 MG tablet   rivaroxaban (XARELTO) 20 MG TABS tablet   No current  facility-administered medications for this encounter.   Konrad Felix Ward, PA-C [facility] [phone]

## 2022-08-02 NOTE — H&P (Signed)
KNEE ARTHROPLASTY ADMISSION H&P  Patient ID: Linley Moxley MRN: [id number] DOB/AGE: [birth year] 62 y.o.  Chief Complaint: left knee pain.  Planned Procedure Date: 08/03/22 Medical Clearance by Emelda Fear, DO   HPI: Jonmarc Bodkin is a 62 y.o. male who presents for evaluation of djd left knee. The patient has a history of pain and functional disability in the left knee due to arthritis and has failed non-surgical conservative treatments for greater than 12 weeks to include NSAID's and/or analgesics, corticosteriod injections, use of assistive devices, and activity modification.  Onset of symptoms was gradual, starting 2 years ago with gradually worsening course since that time. The patient noted prior procedures on the knee to include  arthroscopy and menisectomy on the left knee.  Patient currently rates pain at 4 out of 10 with activity. Patient has worsening of pain with activity and weight bearing and pain that interferes with activities of daily living.  Patient has evidence of joint space narrowing by imaging studies.  There is no active infection.  Past Medical History:  Diagnosis Date   Arthritis    Chronic kidney disease    II   Depressive disorder    DVT (deep venous thrombosis) (HCC)    Epididymitis    HLD (hyperlipidemia)    Peripheral vascular disease (HCC)    Prostate cancer (HCC)    Pulmonary embolism (HCC)    Steatosis of liver    Past Surgical History:  Procedure Laterality Date   KNEE ARTHROSCOPY  1995   meniscus removal   KNEE ARTHROSCOPY Left 1985   MEDIAL PARTIAL KNEE REPLACEMENT Right 2019   PROSTATECTOMY  2020   REVISION TOTAL SHOULDER TO REVERSE TOTAL SHOULDER Right 10/21/2021   Procedure: REVISION TOTAL SHOULDER TO REVERSE TOTAL SHOULDER;  Surgeon: Hiram Gash, MD;  Location: [facility];  Service: Orthopedics;  Laterality: Right;   SHOULDER ARTHROSCOPY W/ ROTATOR CUFF REPAIR Right 2001   TOTAL SHOULDER REPLACEMENT Right 2015   No Known Allergies Prior  to Admission medications   Medication Sig Start Date End Date Taking? Authorizing Provider  acetaminophen (TYLENOL) 500 MG tablet Take 1,000 mg by mouth every 6 (six) hours as needed for moderate pain.   Yes [provider]  dicyclomine (BENTYL) 20 MG tablet Take 20 mg by mouth 3 (three) times daily.   Yes [provider]  donepezil (ARICEPT) 5 MG tablet Take 5 mg by mouth daily. 05/31/22  Yes [provider]  fenofibrate 54 MG tablet Take 54 mg by mouth daily.   Yes [provider]  fluvoxaMINE (LUVOX) 100 MG tablet Take 200 mg by mouth daily. 08/21/21  Yes [provider]  rivaroxaban (XARELTO) 20 MG TABS tablet Take 20 mg by mouth daily. 04/21/20  Yes [provider]   Social History   Socioeconomic History   Marital status: Married    Spouse name: Not on file   Number of children: Not on file   Years of education: Not on file   Highest education level: Not on file  Occupational History   Not on file  Tobacco Use   Smoking status: Some Days    Packs/day: 0.50    Years: 38.00    Total pack years: 19.00    Types: Cigarettes   Smokeless tobacco: Current    Types: Chew  Vaping Use   Vaping Use: Never used  Substance and Sexual Activity   Alcohol use: Never   Drug use: Never   Sexual activity: Not on file  Other Topics Concern   Not on file  Social History Narrative   Not on file   Social Determinants of Health   Financial Resource Strain: Not on file  Food Insecurity: Not on file  Transportation Needs: Not on file  Physical Activity: Not on file  Stress: Not on file  Social Connections: Not on file   No family history on file.  ROS: He does have a history of a left leg DVT and also a PE.  He is currently on Xarelto.  No history of MI or CVA.  No recent chest pain, shortness of breath, nausea, vomiting, abdominal pain, lightheadedness, dizziness, fevers or chills.  He has partial dentures which he will remove prior to  surgery.   All other systems have been reviewed and were otherwise currently negative with the exception of those mentioned in the HPI and as above.  Objective: Vitals: Height: 5?10?Marland Kitchen  Weight: 234 pounds.  BMI: 33.1.  Blood pressure: 118/80.  O2 SAT: 94% on room air.  Temperature: 91.8.  Pulse: 90. Physical Exam: General: Alert, NAD.  Antalgic Gait  HEENT: EOMI, Good Neck Extension  Pulm: No increased work of breathing.  Clear B/L A/P w/o crackle or wheeze.  CV: RRR, No m/g/r appreciated  GI: soft, NT, ND Neuro: Neuro without gross focal deficit.  Sensation intact distally Skin: No lesions in the area of chief complaint MSK/Surgical Site: He has 0-115 degrees of range of motion of the left knee.  Varus alignment with pseudolaxity.  Negative patella grind.  Positive medial joint line tenderness.  Neurovascularly intact distally.    NVI.  Stable varus and valgus stress.    Imaging Review Plain radiographs demonstrate severe degenerative joint disease of the left knee.   The overall alignment is varus. The bone quality appears to be adequate for age and reported activity level.  Preoperative templating of the joint replacement has been completed, documented, and submitted to the Operating Room personnel in order to optimize intra-operative equipment management.  Assessment: Left anteromedial knee osteoarthritis   Plan: Plan for Procedure(s): UNICOMPARTMENTAL KNEE  The patient history, physical exam, clinical judgement of the provider and imaging are consistent with end stage degenerative joint disease and unicompartmental joint arthroplasty is deemed medically necessary. The treatment options including medical management, injection therapy, and arthroplasty were discussed at length. The risks and benefits of Procedure(s): UNICOMPARTMENTAL KNEE were presented and reviewed.  The risks of nonoperative treatment, versus surgical intervention including but not limited to continued pain,  aseptic loosening, stiffness, dislocation/subluxation, infection, bleeding, nerve injury, blood clots, cardiopulmonary complications, morbidity, mortality, among others were discussed. The patient verbalizes understanding and wishes to proceed with the plan.  Patient is being admitted for inpatient treatment for surgery, pain control, PT, prophylactic antibiotics, VTE prophylaxis, progressive ambulation, ADL's and discharge planning.   Dental prophylaxis discussed and recommended for 2 years postoperatively.  The patient does not meet the criteria for TXA which will be used perioperatively.   Baseline Xarelto  will be used postoperatively for DVT prophylaxis in addition to SCDs, and early ambulation. The patient is planning to be discharged home with OPPT in care of family.   Ventura Bruns, PA-C 08/02/2022 2:11 PM

## 2022-08-02 NOTE — Anesthesia Preprocedure Evaluation (Signed)
Anesthesia Evaluation  Patient identified by MRN, date of birth, ID band Patient awake    Reviewed: Allergy & Precautions, NPO status , Patient's Chart, lab work & pertinent test results  History of Anesthesia Complications Negative for: history of anesthetic complications  Airway Mallampati: II  TM Distance: >3 FB Neck ROM: Full    Dental  (+) Poor Dentition, Dental Advisory Given, Missing   Pulmonary Current Smoker and Patient abstained from smoking., PE   breath sounds clear to auscultation       Cardiovascular (-) angina + Peripheral Vascular Disease and + DVT   Rhythm:Regular Rate:Normal     Neuro/Psych    Depression    negative neurological ROS     GI/Hepatic negative GI ROS, Neg liver ROS,,,  Endo/Other  BMI 32  Renal/GU Renal InsufficiencyRenal disease     Musculoskeletal  (+) Arthritis ,    Abdominal  (+) + obese  Peds  Hematology Xarelto: last dose 72hrs ago (sat am)   Anesthesia Other Findings   Reproductive/Obstetrics                             Anesthesia Physical Anesthesia Plan  ASA: 3  Anesthesia Plan: Spinal   Post-op Pain Management: Regional block* and Tylenol PO (pre-op)*   Induction:   PONV Risk Score and Plan: 0 and Ondansetron  Airway Management Planned: Natural Airway and Simple Face Mask  Additional Equipment: None  Intra-op Plan:   Post-operative Plan:   Informed Consent: I have reviewed the patients History and Physical, chart, labs and discussed the procedure including the risks, benefits and alternatives for the proposed anesthesia with the patient or authorized representative who has indicated his/her understanding and acceptance.     Dental advisory given  Plan Discussed with: CRNA and Surgeon  Anesthesia Plan Comments: (Plan routine monitor, SAB with adductor canal block for post op analgesia)       Anesthesia Quick Evaluation

## 2022-08-03 ENCOUNTER — Ambulatory Visit (HOSPITAL_COMMUNITY): Payer: BC Managed Care – PPO | Admitting: Physician Assistant

## 2022-08-03 ENCOUNTER — Encounter (HOSPITAL_COMMUNITY): Payer: Self-pay | Admitting: Orthopedic Surgery

## 2022-08-03 ENCOUNTER — Ambulatory Visit (HOSPITAL_COMMUNITY): Payer: BC Managed Care – PPO

## 2022-08-03 ENCOUNTER — Ambulatory Visit (HOSPITAL_COMMUNITY)
Admission: RE | Admit: 2022-08-03 | Discharge: 2022-08-03 | Disposition: A | Discharge status: Home / Self Care | Payer: BC Managed Care – PPO | Source: Ambulatory Visit | Attending: Orthopedic Surgery | Admitting: Orthopedic Surgery

## 2022-08-03 ENCOUNTER — Encounter (HOSPITAL_COMMUNITY)
Admission: RE | Disposition: A | Discharge status: Home / Self Care | Payer: Self-pay | Source: Ambulatory Visit | Attending: Orthopedic Surgery

## 2022-08-03 ENCOUNTER — Ambulatory Visit (HOSPITAL_COMMUNITY): Payer: BC Managed Care – PPO | Admitting: Anesthesiology

## 2022-08-03 ENCOUNTER — Other Ambulatory Visit: Payer: Self-pay

## 2022-08-03 DIAGNOSIS — Z86718 Personal history of other venous thrombosis and embolism: Secondary | ICD-10-CM | POA: Diagnosis not present

## 2022-08-03 DIAGNOSIS — F17210 Nicotine dependence, cigarettes, uncomplicated: Secondary | ICD-10-CM | POA: Insufficient documentation

## 2022-08-03 DIAGNOSIS — F32A Depression, unspecified: Secondary | ICD-10-CM | POA: Insufficient documentation

## 2022-08-03 DIAGNOSIS — Z7901 Long term (current) use of anticoagulants: Secondary | ICD-10-CM | POA: Diagnosis not present

## 2022-08-03 DIAGNOSIS — M1712 Unilateral primary osteoarthritis, left knee: Secondary | ICD-10-CM | POA: Insufficient documentation

## 2022-08-03 DIAGNOSIS — Z96652 Presence of left artificial knee joint: Secondary | ICD-10-CM

## 2022-08-03 HISTORY — PX: PARTIAL KNEE ARTHROPLASTY: SHX2174

## 2022-08-03 SURGERY — ARTHROPLASTY, KNEE, UNICOMPARTMENTAL
Anesthesia: Spinal | Site: Knee | Laterality: Left

## 2022-08-03 MED ORDER — CEFAZOLIN SODIUM-DEXTROSE 2-4 GM/100ML-% IV SOLN
2.0000 g | INTRAVENOUS | Status: AC
  Administered 2022-08-03: 2 g via INTRAVENOUS
  Filled 2022-08-03: qty 100

## 2022-08-03 MED ORDER — OXYCODONE HCL 5 MG PO TABS
ORAL_TABLET | ORAL | Status: AC
  Filled 2022-08-03: qty 1

## 2022-08-03 MED ORDER — ACETAMINOPHEN 500 MG PO TABS
1000.0000 mg | ORAL_TABLET | Freq: Once | ORAL | Status: AC
  Administered 2022-08-03: 1000 mg via ORAL

## 2022-08-03 MED ORDER — KETOROLAC TROMETHAMINE 30 MG/ML IJ SOLN
INTRAMUSCULAR | Status: DC | PRN
  Administered 2022-08-03: 30 mg via INTRAVENOUS

## 2022-08-03 MED ORDER — STERILE WATER FOR IRRIGATION IR SOLN
Status: DC | PRN
  Administered 2022-08-03: 2000 mL

## 2022-08-03 MED ORDER — VANCOMYCIN HCL IN DEXTROSE 1-5 GM/200ML-% IV SOLN
INTRAVENOUS | Status: AC
  Filled 2022-08-03: qty 200

## 2022-08-03 MED ORDER — ROPIVACAINE HCL 7.5 MG/ML IJ SOLN
INTRAMUSCULAR | Status: DC | PRN
  Administered 2022-08-03: 20 mL via PERINEURAL

## 2022-08-03 MED ORDER — SENNA-DOCUSATE SODIUM 8.6-50 MG PO TABS: 2.0000 | ORAL_TABLET | Freq: Every day | ORAL | 1 refills | Status: AC

## 2022-08-03 MED ORDER — BUPIVACAINE HCL 0.25 % IJ SOLN
INTRAMUSCULAR | Status: AC
  Filled 2022-08-03: qty 1

## 2022-08-03 MED ORDER — CHLORHEXIDINE GLUCONATE 0.12 % MT SOLN
15.0000 mL | Freq: Once | OROMUCOSAL | Status: AC
  Administered 2022-08-03: 15 mL via OROMUCOSAL

## 2022-08-03 MED ORDER — MIDAZOLAM HCL 2 MG/2ML IJ SOLN: 0.5000 mg | Freq: Once | INTRAMUSCULAR | Status: DC | PRN

## 2022-08-03 MED ORDER — PROPOFOL 1000 MG/100ML IV EMUL
INTRAVENOUS | Status: AC
  Filled 2022-08-03: qty 100

## 2022-08-03 MED ORDER — MEPERIDINE HCL 50 MG/ML IJ SOLN: 6.2500 mg | INTRAMUSCULAR | Status: DC | PRN

## 2022-08-03 MED ORDER — HYDROMORPHONE HCL 1 MG/ML IJ SOLN: 0.2500 mg | INTRAMUSCULAR | Status: DC | PRN

## 2022-08-03 MED ORDER — ORAL CARE MOUTH RINSE: 15.0000 mL | Freq: Once | OROMUCOSAL | Status: AC

## 2022-08-03 MED ORDER — OXYCODONE HCL 5 MG PO TABS: 5.0000 mg | ORAL_TABLET | Freq: Once | ORAL | Status: DC | PRN

## 2022-08-03 MED ORDER — METHOCARBAMOL 500 MG IVPB - SIMPLE MED: 500.0000 mg | Freq: Four times a day (QID) | INTRAVENOUS | Status: DC | PRN

## 2022-08-03 MED ORDER — MIDAZOLAM HCL 5 MG/5ML IJ SOLN
INTRAMUSCULAR | Status: DC | PRN
  Administered 2022-08-03: 2 mg via INTRAVENOUS

## 2022-08-03 MED ORDER — OXYCODONE HCL 5 MG/5ML PO SOLN: 5.0000 mg | Freq: Once | ORAL | Status: DC | PRN

## 2022-08-03 MED ORDER — SODIUM CHLORIDE (PF) 0.9 % IJ SOLN
INTRAMUSCULAR | Status: DC | PRN
  Administered 2022-08-03: 1000 mL via INTRAVENOUS

## 2022-08-03 MED ORDER — ONDANSETRON HCL 4 MG/2ML IJ SOLN
INTRAMUSCULAR | Status: AC
  Filled 2022-08-03: qty 2

## 2022-08-03 MED ORDER — KETOROLAC TROMETHAMINE 30 MG/ML IJ SOLN
INTRAMUSCULAR | Status: AC
  Filled 2022-08-03: qty 1

## 2022-08-03 MED ORDER — 0.9 % SODIUM CHLORIDE (POUR BTL) OPTIME
TOPICAL | Status: DC | PRN
  Administered 2022-08-03: 1000 mL

## 2022-08-03 MED ORDER — ONDANSETRON HCL 4 MG PO TABS: 4.0000 mg | ORAL_TABLET | Freq: Three times a day (TID) | ORAL | 0 refills | Status: AC | PRN

## 2022-08-03 MED ORDER — BUPIVACAINE HCL 0.25 % IJ SOLN
INTRAMUSCULAR | Status: DC | PRN
  Administered 2022-08-03: 30 mL

## 2022-08-03 MED ORDER — LACTATED RINGERS IV SOLN: INTRAVENOUS | Status: DC

## 2022-08-03 MED ORDER — FENTANYL CITRATE (PF) 100 MCG/2ML IJ SOLN
INTRAMUSCULAR | Status: DC | PRN
  Administered 2022-08-03 (×2): 50 ug via INTRAVENOUS

## 2022-08-03 MED ORDER — LACTATED RINGERS IV BOLUS
500.0000 mL | Freq: Once | INTRAVENOUS | Status: AC
  Administered 2022-08-03: 500 mL via INTRAVENOUS

## 2022-08-03 MED ORDER — LACTATED RINGERS IV BOLUS
250.0000 mL | Freq: Once | INTRAVENOUS | Status: AC
  Administered 2022-08-03: 250 mL via INTRAVENOUS

## 2022-08-03 MED ORDER — OXYCODONE HCL 5 MG PO TABS: 5.0000 mg | ORAL_TABLET | ORAL | 0 refills | Status: AC | PRN

## 2022-08-03 MED ORDER — ONDANSETRON HCL 4 MG/2ML IJ SOLN
INTRAMUSCULAR | Status: DC | PRN
  Administered 2022-08-03: 4 mg via INTRAVENOUS

## 2022-08-03 MED ORDER — OXYCODONE HCL 5 MG PO TABS
5.0000 mg | ORAL_TABLET | ORAL | Status: DC | PRN
  Administered 2022-08-03: 5 mg via ORAL

## 2022-08-03 MED ORDER — EPINEPHRINE PF 1 MG/ML IJ SOLN
INTRAMUSCULAR | Status: AC
  Filled 2022-08-03: qty 1

## 2022-08-03 MED ORDER — LACTATED RINGERS IV BOLUS: 250.0000 mL | Freq: Once | INTRAVENOUS | Status: DC

## 2022-08-03 MED ORDER — POVIDONE-IODINE 7.5 % EX SOLN: Freq: Once | CUTANEOUS | Status: DC

## 2022-08-03 MED ORDER — BACLOFEN 10 MG PO TABS: 10.0000 mg | ORAL_TABLET | Freq: Three times a day (TID) | ORAL | 0 refills | Status: AC

## 2022-08-03 MED ORDER — PROPOFOL 10 MG/ML IV BOLUS
INTRAVENOUS | Status: DC | PRN
  Administered 2022-08-03: 30 mg via INTRAVENOUS

## 2022-08-03 MED ORDER — PROPOFOL 10 MG/ML IV BOLUS
INTRAVENOUS | Status: AC
  Filled 2022-08-03: qty 20

## 2022-08-03 MED ORDER — PROMETHAZINE HCL 25 MG/ML IJ SOLN: 6.2500 mg | INTRAMUSCULAR | Status: DC | PRN

## 2022-08-03 MED ORDER — FENTANYL CITRATE (PF) 100 MCG/2ML IJ SOLN
INTRAMUSCULAR | Status: AC
  Filled 2022-08-03: qty 2

## 2022-08-03 MED ORDER — ROCURONIUM BROMIDE 10 MG/ML (PF) SYRINGE
PREFILLED_SYRINGE | INTRAVENOUS | Status: AC
  Filled 2022-08-03: qty 10

## 2022-08-03 MED ORDER — POVIDONE-IODINE 10 % EX SWAB: 2.0000 | Freq: Once | CUTANEOUS | Status: DC

## 2022-08-03 MED ORDER — DEXAMETHASONE SODIUM PHOSPHATE 10 MG/ML IJ SOLN
INTRAMUSCULAR | Status: AC
  Filled 2022-08-03: qty 1

## 2022-08-03 MED ORDER — ACETAMINOPHEN 500 MG PO TABS
1000.0000 mg | ORAL_TABLET | Freq: Once | ORAL | Status: DC
  Filled 2022-08-03: qty 2

## 2022-08-03 MED ORDER — PHENYLEPHRINE HCL-NACL 20-0.9 MG/250ML-% IV SOLN
INTRAVENOUS | Status: AC
  Filled 2022-08-03: qty 250

## 2022-08-03 MED ORDER — EPHEDRINE SULFATE-NACL 50-0.9 MG/10ML-% IV SOSY
PREFILLED_SYRINGE | INTRAVENOUS | Status: DC | PRN
  Administered 2022-08-03: 5 mg via INTRAVENOUS

## 2022-08-03 MED ORDER — DEXAMETHASONE SODIUM PHOSPHATE 10 MG/ML IJ SOLN
INTRAMUSCULAR | Status: DC | PRN
  Administered 2022-08-03: 10 mg via INTRAVENOUS

## 2022-08-03 MED ORDER — METHOCARBAMOL 500 MG PO TABS: 500.0000 mg | ORAL_TABLET | Freq: Four times a day (QID) | ORAL | Status: DC | PRN

## 2022-08-03 MED ORDER — MIDAZOLAM HCL 2 MG/2ML IJ SOLN
INTRAMUSCULAR | Status: AC
  Filled 2022-08-03: qty 2

## 2022-08-03 MED ORDER — PROPOFOL 500 MG/50ML IV EMUL
INTRAVENOUS | Status: DC | PRN
  Administered 2022-08-03: 100 ug/kg/min via INTRAVENOUS

## 2022-08-03 MED ORDER — BUPIVACAINE IN DEXTROSE 0.75-8.25 % IT SOLN
INTRATHECAL | Status: DC | PRN
  Administered 2022-08-03: 12 mg via INTRATHECAL

## 2022-08-03 SURGICAL SUPPLY — 70 items
ADH SKN CLS APL DERMABOND .7 (GAUZE/BANDAGES/DRESSINGS) ×2
BAG COUNTER SPONGE SURGICOUNT (BAG) IMPLANT
BAG SPEC THK2 15X12 ZIP CLS (MISCELLANEOUS) ×1
BAG SPNG CNTER NS LX DISP (BAG)
BAG ZIPLOCK 12X15 (MISCELLANEOUS) ×1 IMPLANT
BANDAGE ESMARK 6X9 LF (GAUZE/BANDAGES/DRESSINGS) ×1 IMPLANT
BEARING TIBIAL STRL SZ3 LG (Knees) IMPLANT
BLADE SURG 15 STRL LF DISP TIS (BLADE) ×1 IMPLANT
BLADE SURG 15 STRL SS (BLADE) ×1
BNDG CMPR 9X6 STRL LF SNTH (GAUZE/BANDAGES/DRESSINGS) ×1
BNDG CMPR MED 15X6 ELC VLCR LF (GAUZE/BANDAGES/DRESSINGS) ×1
BNDG ELASTIC 6X15 VLCR STRL LF (GAUZE/BANDAGES/DRESSINGS) ×1 IMPLANT
BNDG ESMARK 6X9 LF (GAUZE/BANDAGES/DRESSINGS) ×1
BONE CEMENT HUM AFFIXUS (Cement) ×1 IMPLANT
BOWL SMART MIX CTS (DISPOSABLE) ×1 IMPLANT
BRNG TIB LRG 3 PHS 3 LT MEN (Knees) ×1 IMPLANT
CEMENT BONE HUM AFFIXUS (Cement) IMPLANT
CEMENT BONE R 1X40 (Cement) IMPLANT
CLSR STERI-STRIP ANTIMIC 1/2X4 (GAUZE/BANDAGES/DRESSINGS) ×1 IMPLANT
COVER SURGICAL LIGHT HANDLE (MISCELLANEOUS) ×1 IMPLANT
CUFF TOURN SGL QUICK 34 (TOURNIQUET CUFF) ×1
CUFF TRNQT CYL 34X4.125X (TOURNIQUET CUFF) ×1 IMPLANT
DERMABOND ADVANCED .7 DNX12 (GAUZE/BANDAGES/DRESSINGS) IMPLANT
DRAPE EXTREMITY T 121X128X90 (DISPOSABLE) ×1 IMPLANT
DRAPE POUCH INSTRU U-SHP 10X18 (DRAPES) ×1 IMPLANT
DRAPE SHEET LG 3/4 BI-LAMINATE (DRAPES) ×1 IMPLANT
DRAPE U-SHAPE 47X51 STRL (DRAPES) ×1 IMPLANT
DRSG MEPILEX POST OP 4X8 (GAUZE/BANDAGES/DRESSINGS) ×1 IMPLANT
DURAPREP 26ML APPLICATOR (WOUND CARE) ×2 IMPLANT
ELECT REM PT RETURN 15FT ADLT (MISCELLANEOUS) ×1 IMPLANT
FACESHIELD WRAPAROUND (MASK) ×1 IMPLANT
FACESHIELD WRAPAROUND OR TEAM (MASK) ×1 IMPLANT
GAUZE PAD ABD 8X10 STRL (GAUZE/BANDAGES/DRESSINGS) ×1 IMPLANT
GLOVE BIO SURGEON STRL SZ 6.5 (GLOVE) ×1 IMPLANT
GLOVE BIOGEL PI IND STRL 7.0 (GLOVE) ×1 IMPLANT
GLOVE BIOGEL PI IND STRL 8 (GLOVE) ×1 IMPLANT
GOWN STRL REUS W/ TWL LRG LVL3 (GOWN DISPOSABLE) ×2 IMPLANT
GOWN STRL REUS W/TWL LRG LVL3 (GOWN DISPOSABLE) ×2
HANDPIECE INTERPULSE COAX TIP (DISPOSABLE) ×1
HOLDER FOLEY CATH W/STRAP (MISCELLANEOUS) IMPLANT
HOOD PEEL AWAY T7 (MISCELLANEOUS) ×2 IMPLANT
IMMOBILIZER KNEE 20 (SOFTGOODS)
IMMOBILIZER KNEE 20 THIGH 36 (SOFTGOODS) IMPLANT
IMMOBILIZER KNEE 22 UNIV (SOFTGOODS) IMPLANT
KIT BASIN OR (CUSTOM PROCEDURE TRAY) ×1 IMPLANT
KIT TURNOVER KIT A (KITS) IMPLANT
NDL SAFETY ECLIP 18X1.5 (MISCELLANEOUS) ×1 IMPLANT
NS IRRIG 1000ML POUR BTL (IV SOLUTION) ×1 IMPLANT
PACK BLADE SAW RECIP 70 3 PT (BLADE) IMPLANT
PACK TOTAL JOINT (CUSTOM PROCEDURE TRAY) ×1 IMPLANT
PEG FEMORAL CEMENT STRL LRG (Knees) IMPLANT
PROTECTOR NERVE ULNAR (MISCELLANEOUS) ×1 IMPLANT
SET HNDPC FAN SPRY TIP SCT (DISPOSABLE) ×1 IMPLANT
SPIKE FLUID TRANSFER (MISCELLANEOUS) IMPLANT
SUCTION FRAZIER HANDLE 12FR (TUBING) ×1
SUCTION TUBE FRAZIER 12FR DISP (TUBING) ×1 IMPLANT
SUT VIC AB 1 CT1 36 (SUTURE) ×1 IMPLANT
SUT VIC AB 2-0 CT1 27 (SUTURE) ×1
SUT VIC AB 2-0 CT1 TAPERPNT 27 (SUTURE) ×1 IMPLANT
SUT VIC AB 3-0 SH 27 (SUTURE) ×2
SUT VIC AB 3-0 SH 27X BRD (SUTURE) ×2 IMPLANT
SYR 30ML LL (SYRINGE) ×1 IMPLANT
SYR 3ML LL SCALE MARK (SYRINGE) ×1 IMPLANT
TOWEL OR 17X26 10 PK STRL BLUE (TOWEL DISPOSABLE) ×1 IMPLANT
TOWEL OR NON WOVEN STRL DISP B (DISPOSABLE) ×1 IMPLANT
TRAY FOLEY MTR SLVR 16FR STAT (SET/KITS/TRAYS/PACK) ×1 IMPLANT
TRAY TIBIAL OXFORD SZ E LEFT (Joint) IMPLANT
TUBE SUCTION HIGH CAP CLEAR NV (SUCTIONS) ×1 IMPLANT
WATER STERILE IRR 1000ML POUR (IV SOLUTION) ×1 IMPLANT
WRAP KNEE MAXI GEL POST OP (GAUZE/BANDAGES/DRESSINGS) ×1 IMPLANT

## 2022-08-03 NOTE — Interval H&P Note (Signed)
History and Physical Interval Note:  08/03/2022 6:38 AM  Chad Levy  has presented today for surgery, with the diagnosis of djd left knee.  The various methods of treatment have been discussed with the patient and family. After consideration of risks, benefits and other options for treatment, the patient has consented to  Procedure(s): UNICOMPARTMENTAL KNEE (Left) as a surgical intervention.  The patient's history has been reviewed, patient examined, no change in status, stable for surgery.  I have reviewed the patient's chart and labs.  Questions were answered to the patient's satisfaction.     Johnny Bridge

## 2022-08-03 NOTE — Discharge Instructions (Signed)
INSTRUCTIONS AFTER JOINT REPLACEMENT   Remove items at home which could result in a fall. This includes throw rugs or furniture in walking pathways ICE to the affected joint every three hours while awake for 30 minutes at a time, for at least the first 3-5 days, and then as needed for pain and swelling.  Continue to use ice for pain and swelling. You may notice swelling that will progress down to the foot and ankle.  This is normal after surgery.  Elevate your leg when you are not up walking on it.   Continue to use the breathing machine you got in the hospital (incentive spirometer) which will help keep your temperature down.  It is common for your temperature to cycle up and down following surgery, especially at night when you are not up moving around and exerting yourself.  The breathing machine keeps your lungs expanded and your temperature down.   DIET:  As you were doing prior to hospitalization, we recommend a well-balanced diet.  DRESSING / WOUND CARE / SHOWERING  You may shower 3 days after surgery, but keep the wounds dry during showering.  You may use an occlusive plastic wrap (Press'n Seal for example), NO SOAKING/SUBMERGING IN THE BATHTUB.  If the bandage gets wet, change with a clean dry gauze.  If the incision gets wet, pat the wound dry with a clean towel. You have a large ace wrap with 2 abdominal pads currently on your knee. Please take this off 24 hours after surgery, you have a sticky dressing underneath which is the only dressing you need to keep on. After removing the large ace wrap, you should wear your ted hose stockings during the day. You may take them off when sleeping at night.   ACTIVITY  Increase activity slowly as tolerated, but follow the weight bearing instructions below.   No driving for 6 weeks or until further direction given by your physician.  You cannot drive while taking narcotics.  No lifting or carrying greater than 10 lbs. until further directed by your  surgeon. Avoid periods of inactivity such as sitting longer than an hour when not asleep. This helps prevent blood clots.  You may return to work once you are authorized by your doctor.     WEIGHT BEARING   Weight bearing as tolerated with assist device (walker, cane, etc) as directed, use it as long as suggested by your surgeon or therapist, typically at least 4-6 weeks.   EXERCISES  Results after joint replacement surgery are often greatly improved when you follow the exercise, range of motion and muscle strengthening exercises prescribed by your doctor. Safety measures are also important to protect the joint from further injury. Any time any of these exercises cause you to have increased pain or swelling, decrease what you are doing until you are comfortable again and then slowly increase them. If you have problems or questions, call your caregiver or physical therapist for advice.   Rehabilitation is important following a joint replacement. After just a few days of immobilization, the muscles of the leg can become weakened and shrink (atrophy).  These exercises are designed to build up the tone and strength of the thigh and leg muscles and to improve motion. Often times heat used for twenty to thirty minutes before working out will loosen up your tissues and help with improving the range of motion but do not use heat for the first two weeks following surgery (sometimes heat can increase post-operative swelling).  These exercises can be done on a training (exercise) mat, on the floor, on a table or on a bed. Use whatever works the best and is most comfortable for you.    Use music or television while you are exercising so that the exercises are a pleasant break in your day. This will make your life better with the exercises acting as a break in your routine that you can look forward to.   Perform all exercises about fifteen times, three times per day or as directed.  You should exercise both the  operative leg and the other leg as well.  Exercises include:   Quad Sets - Tighten up the muscle on the front of the thigh (Quad) and hold for 5-10 seconds.   Straight Leg Raises - With your knee straight (if you were given a brace, keep it on), lift the leg to 60 degrees, hold for 3 seconds, and slowly lower the leg.  Perform this exercise against resistance later as your leg gets stronger.  Leg Slides: Lying on your back, slowly slide your foot toward your buttocks, bending your knee up off the floor (only go as far as is comfortable). Then slowly slide your foot back down until your leg is flat on the floor again.  Angel Wings: Lying on your back spread your legs to the side as far apart as you can without causing discomfort.  Hamstring Strength:  Lying on your back, push your heel against the floor with your leg straight by tightening up the muscles of your buttocks.  Repeat, but this time bend your knee to a comfortable angle, and push your heel against the floor.  You may put a pillow under the heel to make it more comfortable if necessary.   A rehabilitation program following joint replacement surgery can speed recovery and prevent re-injury in the future due to weakened muscles. Contact your doctor or a physical therapist for more information on knee rehabilitation.    CONSTIPATION  Constipation is defined medically as fewer than three stools per week and severe constipation as less than one stool per week.  Even if you have a regular bowel pattern at home, your normal regimen is likely to be disrupted due to multiple reasons following surgery.  Combination of anesthesia, postoperative narcotics, change in appetite and fluid intake all can affect your bowels.   YOU MUST use at least one of the following options; they are listed in order of increasing strength to get the job done.  They are all available over the counter, and you may need to use some, POSSIBLY even all of these options:     Drink plenty of fluids (prune juice may be helpful) and high fiber foods Colace 100 mg by mouth twice a day  Senokot for constipation as directed and as needed Dulcolax (bisacodyl), take with full glass of water  Miralax (polyethylene glycol) once or twice a day as needed.  If you have tried all these things and are unable to have a bowel movement in the first 3-4 days after surgery call either your surgeon or your primary doctor.    If you experience loose stools or diarrhea, hold the medications until you stool forms back up.  If your symptoms do not get better within 1 week or if they get worse, check with your doctor.  If you experience "the worst abdominal pain ever" or develop nausea or vomiting, please contact the office immediately for further recommendations for treatment.  ITCHING:  If you experience itching with your medications, try taking only a single pain pill, or even half a pain pill at a time.  You can also use Benadryl over the counter for itching or also to help with sleep.   TED HOSE STOCKINGS:  Use stockings on both legs until for at least 2 weeks or as directed by physician office. They may be removed at night for sleeping.  MEDICATIONS:  See your medication summary on the "After Visit Summary" that nursing will review with you.  You may have some home medications which will be placed on hold until you complete the course of blood thinner medication.  It is important for you to complete the blood thinner medication as prescribed.  PRECAUTIONS:  If you experience chest pain or shortness of breath - call 911 immediately for transfer to the hospital emergency department.   If you develop a fever greater that 101 F, purulent drainage from wound, increased redness or drainage from wound, foul odor from the wound/dressing, or calf pain - CONTACT YOUR SURGEON.                                                   FOLLOW-UP APPOINTMENTS:  If you do not already have a post-op  appointment, please call the office for an appointment to be seen by your surgeon.  Guidelines for how soon to be seen are listed in your "After Visit Summary", but are typically between 1-4 weeks after surgery.  OTHER INSTRUCTIONS:   Knee Replacement:  Do not place pillow under knee, focus on keeping the knee straight while resting. CPM instructions: 0-90 degrees, 2 hours in the morning, 2 hours in the afternoon, and 2 hours in the evening. Place foam block, curve side up under heel at all times except when in CPM or when walking.  DO NOT modify, tear, cut, or change the foam block in any way.  POST-OPERATIVE OPIOID TAPER INSTRUCTIONS: It is important to wean off of your opioid medication as soon as possible. If you do not need pain medication after your surgery it is ok to stop day one. Opioids include: Codeine, Hydrocodone(Norco, Vicodin), Oxycodone(Percocet, oxycontin) and hydromorphone amongst others.  Long term and even short term use of opiods can cause: Increased pain response Dependence Constipation Depression Respiratory depression And more.  Withdrawal symptoms can include Flu like symptoms Nausea, vomiting And more Techniques to manage these symptoms Hydrate well Eat regular healthy meals Stay active Use relaxation techniques(deep breathing, meditating, yoga) Do Not substitute Alcohol to help with tapering If you have been on opioids for less than two weeks and do not have pain than it is ok to stop all together.  Plan to wean off of opioids This plan should start within one week post op of your joint replacement. Maintain the same interval or time between taking each dose and first decrease the dose.  Cut the total daily intake of opioids by one tablet each day Next start to increase the time between doses. The last dose that should be eliminated is the evening dose.   MAKE SURE YOU:  Understand these instructions.  Get help right away if you are not doing well or  get worse.    Thank you for letting us be a part of your medical care team.  It is a privilege  we respect greatly.  We hope these instructions will help you stay on track for a fast and full recovery!

## 2022-08-03 NOTE — Anesthesia Procedure Notes (Signed)
Spinal  Patient location during procedure: OR End time: 08/03/2022 7:45 AM Reason for block: surgical anesthesia Staffing Performed: anesthesiologist  Anesthesiologist: Annye Asa, MD Resident/CRNA: [place] Stephanie C, CRNA Performed by: Annye Asa, MD Authorized by: Annye Asa, MD   Preanesthetic Checklist Completed: patient identified, IV checked, site marked, risks and benefits discussed, surgical consent, monitors and equipment checked, pre-op evaluation and timeout performed Spinal Block Patient position: sitting Prep: DuraPrep Patient monitoring: heart rate, cardiac monitor, continuous pulse ox and blood pressure Approach: midline Location: L3-4 Injection technique: single-shot Needle Needle type: Pencan and Introducer  Needle gauge: 24 G Needle length: 9 cm Assessment Events: CSF return and second provider Additional Notes Pt identified in Operating room.  Monitors applied. Working IV access confirmed. Sterile prep, drape lumbar spine.  1% lido local L 3,4, CRNA unsuccessful.  #24ga Pencan into clear CSF L 3,4.  '12mg'$  0.75% Bupivacaine with dextrose injected with asp CSF beginning and end of injection.  Patient asymptomatic, VSS, no heme aspirated, tolerated well.  Jenita Seashore, MD

## 2022-08-03 NOTE — Anesthesia Postprocedure Evaluation (Signed)
Anesthesia Post Note  Patient: Durrel Mcnee  Procedure(s) Performed: UNICOMPARTMENTAL KNEE (Left: Knee)     Patient location during evaluation: Phase II Anesthesia Type: Spinal Level of consciousness: awake and alert, patient cooperative and oriented Pain management: pain level controlled Vital Signs Assessment: post-procedure vital signs reviewed and stable Respiratory status: nonlabored ventilation, spontaneous breathing and respiratory function stable Cardiovascular status: blood pressure returned to baseline and stable Postop Assessment: spinal receding, patient able to bend at knees, no apparent nausea or vomiting and adequate PO intake Anesthetic complications: no   No notable events documented.  Last Vitals:  Vitals:   08/03/22 1203 08/03/22 1212  BP: 119/81   Pulse: 63 (!) 57  Resp: 18   Temp: 36.7 C   SpO2: 98% 97%    Last Pain:  Vitals:   08/03/22 1212  TempSrc:   PainSc: 0-No pain                 Boysie Bonebrake,E. Javad Salva

## 2022-08-03 NOTE — Evaluation (Signed)
Physical Therapy Evaluation Patient Details Name: Chad Levy MRN: [id number] DOB: [birth year] Today's Date: 08/03/2022  History of Present Illness  Pt is a 62yo male presenting s/p L-UKA on 08/03/22. PMH: CKD,  hx of DVT, hx of PE, HLD, PVD, hx of prostate cancer, BPH, R-PKA medial 2019, multiple surgeries on R shoulder.  Clinical Impression  Chad Levy is a 62 y.o. POD 0 s/p s/p L unilateral L knee arthoplasty. Patient reports I  with mobility at baseline. Patient is now limited by functional impairments (see PT problem list below) and requires min guard and cues for transfers and gait with RW. Patient was able to ambulate 35 and an additional 20 feet with RW and min guard and cues for safe walker management. Patient educated on safe sequencing for stair mobility and verbalized safe guarding position for people assisting with mobility. Patient instructed in exercises to facilitate ROM and circulation with HO provided. Pt and family indicate no further questions at this time.  Patient will benefit from continued skilled PT interventions to address impairments and progress towards PLOF in next venue. Patient has met mobility goals at adequate level for discharge home; will continue to follow if pt continues acute stay to progress towards Mod I goals.       Recommendations for follow up therapy are one component of a multi-disciplinary discharge planning process, led by the attending physician.  Recommendations may be updated based on patient status, additional functional criteria and insurance authorization.  Follow Up Recommendations Follow physician's recommendations for discharge plan and follow up therapies      Assistance Recommended at Discharge Frequent or constant Supervision/Assistance  Patient can return home with the following  A little help with walking and/or transfers;A little help with bathing/dressing/bathroom;Assistance with cooking/housework;Assist for transportation;Help  with stairs or ramp for entrance    Equipment Recommendations Other (comment) (Pt reported DME including RW and SPC, none needed)  Recommendations for Other Services       Functional Status Assessment Patient has had a recent decline in their functional status and demonstrates the ability to make significant improvements in function in a reasonable and predictable amount of time.     Precautions / Restrictions Precautions Precautions: Fall;Knee Precaution Booklet Issued: No Precaution Comments: no pillow under the knee Restrictions Weight Bearing Restrictions: No Other Position/Activity Restrictions: wbat      Mobility  Bed Mobility Overal bed mobility: Needs Assistance Bed Mobility: Supine to Sit     Supine to sit: Supervision          Transfers Overall transfer level: Needs assistance Equipment used: Rolling walker (2 wheels) Transfers: Sit to/from Stand Sit to Stand: Min guard           General transfer comment: cues provided for proper UE and AD placement with transfer tasks    Ambulation/Gait Ambulation/Gait assistance: Min guard Gait Distance (Feet): 35 Feet Assistive device: Rolling walker (2 wheels) Gait Pattern/deviations: Step-to pattern, Antalgic Gait velocity: decreased     General Gait Details: slow cadence step almost through pattern and pain increased to 3/10 with mobiliy from 0/10 at rest  Stairs Stairs: Yes Stairs assistance: Min guard Stair Management: Two rails, Step to pattern (cues for proper sequencing and technique) Number of Stairs: 5 General stair comments: step to pattern with cues  Wheelchair Mobility    Modified Rankin (Stroke Patients Only)       Balance Overall balance assessment: Needs assistance Sitting-balance support: Feet supported, No upper extremity supported  Standing balance support: Reliant on assistive device for balance Standing balance-Leahy Scale: Poor                                Pertinent Vitals/Pain Pain Assessment Pain Score: 3  Pain Location: L knee Pain Descriptors / Indicators: Aching, Discomfort Pain Intervention(s): Monitored during session, Limited activity within patient's tolerance (communicated wtih RN per pt pain report)    Home Living Family/patient expects to be discharged to:: Private residence Living Arrangements: Spouse/significant other Available Help at Discharge: Family Type of Home: House Home Access: Stairs to enter Entrance Stairs-Rails: Can reach both     Home Layout: Multi-level (2 Steps to the bedroom pt is able to have B and B on same level)   Additional Comments: I with all ADLs, IADLs, driving no AD  no reported falls.    Prior Function Prior Level of Function : Independent/Modified Independent;Driving             Mobility Comments: IND ADLs Comments: IND     Hand Dominance        Extremity/Trunk Assessment   Upper Extremity Assessment Upper Extremity Assessment: Overall WFL for tasks assessed    Lower Extremity Assessment Lower Extremity Assessment: RLE deficits/detail;LLE deficits/detail RLE Deficits / Details: MMT ank DF/PF 5/5 RLE Sensation: WNL LLE Deficits / Details: MMT ank DF/PF 5/5 LLE Sensation: decreased light touch    Cervical / Trunk Assessment Cervical / Trunk Assessment: Normal  Communication   Communication: No difficulties  Cognition Arousal/Alertness: Awake/alert Behavior During Therapy: WFL for tasks assessed/performed Overall Cognitive Status: Within Functional Limits for tasks assessed                                          General Comments General comments (skin integrity, edema, etc.): Brother JP present    Exercises Total Joint Exercises Ankle Circles/Pumps: AROM, Both, 20 reps Quad Sets: AROM, Left, Other reps (comment) (3) Towel Squeeze: AROM, Both, Other reps (comment) (3) Short Arc Quad: AROM, Left, Other reps (comment) (3) Heel Slides: AROM,  Left, Other reps (comment) (3) Hip ABduction/ADduction: AROM, Both, Other reps (comment) (3) Straight Leg Raises: AROM, Left, Other reps (comment) (3)   Assessment/Plan    PT Assessment Patient needs continued PT services  PT Problem List Decreased strength;Decreased range of motion;Decreased activity tolerance;Decreased balance;Decreased mobility;Pain       PT Treatment Interventions      PT Goals (Current goals can be found in the Care Plan section)  Acute Rehab PT Goals Patient Stated Goal: go home and get stronger    Frequency       Co-evaluation               AM-PAC PT "6 Clicks" Mobility  Outcome Measure Help needed turning from your back to your side while in a flat bed without using bedrails?: A Little Help needed moving from lying on your back to sitting on the side of a flat bed without using bedrails?: A Little Help needed moving to and from a bed to a chair (including a wheelchair)?: A Little Help needed standing up from a chair using your arms (e.g., wheelchair or bedside chair)?: A Little Help needed to walk in hospital room?: A Little Help needed climbing 3-5 steps with a railing? : A Little 6 Click Score: 18  End of Session Equipment Utilized During Treatment: Gait belt Activity Tolerance: Patient tolerated treatment well Patient left: in chair;with call bell/phone within reach;with family/visitor present Nurse Communication: Mobility status PT Visit Diagnosis: Pain;Difficulty in walking, not elsewhere classified (R26.2);Unsteadiness on feet (R26.81);Other abnormalities of gait and mobility (R26.89);Muscle weakness (generalized) (M62.81) Pain - Right/Left: Left Pain - part of body: Knee    Time: 4142-3953 PT Time Calculation (min) (ACUTE ONLY): 45 min   Charges:   PT Evaluation $PT Eval Low Complexity: 1 Low PT Treatments $Gait Training: 8-22 mins $Therapeutic Exercise: 8-22 mins        Baird Lyons, PT   Adair Patter 08/03/2022, 4:06 PM

## 2022-08-03 NOTE — Anesthesia Procedure Notes (Signed)
Anesthesia Regional Block: Adductor canal block   Pre-Anesthetic Checklist: , timeout performed,  Correct Patient, Correct Site, Correct Laterality,  Correct Procedure, Correct Position, site marked,  Risks and benefits discussed,  Surgical consent,  Pre-op evaluation,  At surgeon's request and post-op pain management  Laterality: Left and Lower  Prep: chloraprep       Needles:  Injection technique: Single-shot  Needle Type: Echogenic Needle     Needle Length: 9cm  Needle Gauge: 21     Additional Needles:   Procedures:,,,, ultrasound used (permanent image in chart),,    Narrative:  Start time: 08/03/2022 7:25 AM End time: 08/03/2022 7:31 AM Injection made incrementally with aspirations every 5 mL.  Performed by: Personally  Anesthesiologist: Annye Asa, MD  Additional Notes: Pt identified in Holding room.  Monitors applied. Working IV access confirmed. Sterile prep L thigh.  #21ga ECHOgenic Arrow block needle into adductor canal with US guidance.  20cc 0.75% Ropivacaine injected incrementally after negative test dose.  Patient asymptomatic, VSS, no heme aspirated, tolerated well.   Jenita Seashore, MD

## 2022-08-03 NOTE — Transfer of Care (Signed)
Immediate Anesthesia Transfer of Care Note  Patient: Chad Levy  Procedure(s) Performed: UNICOMPARTMENTAL KNEE (Left: Knee)  Patient Location: PACU  Anesthesia Type:Spinal  Level of Consciousness: awake and drowsy  Airway & Oxygen Therapy: Patient Spontanous Breathing and Patient connected to face mask oxygen  Post-op Assessment: Report given to RN and Post -op Vital signs reviewed and stable  Post vital signs: Reviewed and stable  Last Vitals:  Vitals Value Taken Time  BP    Temp    Pulse 57 08/03/22 0944  Resp 13 08/03/22 0944  SpO2 98 % 08/03/22 0944  Vitals shown include unvalidated device data.  Last Pain:  Vitals:   08/03/22 0559  TempSrc: Oral  PainSc:       Patients Stated Pain Goal: 4 (36/12/24 4975)  Complications: No notable events documented.

## 2022-08-03 NOTE — Op Note (Signed)
08/03/2022  9:17 AM  PATIENT:  Chad Levy    PRE-OPERATIVE DIAGNOSIS: Left knee anteromedial osteoarthritis  POST-OPERATIVE DIAGNOSIS:  Same  PROCEDURE:  Unicompartmental Knee Arthroplasty  SURGEON:  Johnny Bridge, MD  PHYSICIAN ASSISTANT: Merlene Pulling, PA-C, present and scrubbed throughout the case, critical for completion in a timely fashion, and for retraction, instrumentation, and closure.  ANESTHESIA:   Spinal  ESTIMATED BLOOD LOSS: 100 mL  UNIQUE ASPECTS OF THE CASE: He had advanced eburnation on the medial compartment.  The undersurface of the patella looked good, although there was some extensive grade 2 and maybe grade 3 chondral changes on the femoral trochlea, but the lateral side was completely intact.  The ACL was intact.  He ended up being just a very slight 3, but the polyethylene went in very smoothly.  PREOPERATIVE INDICATIONS:  Chad Levy is a  62 y.o. male with a diagnosis of djd left knee who failed conservative measures and elected for surgical management.  He has had a contralateral partial knee replacement that has done well.  The risks benefits and alternatives were discussed with the patient preoperatively including but not limited to the risks of infection, bleeding, nerve injury, cardiopulmonary complications, blood clots, the need for revision surgery, among others, and the patient was willing to proceed.  OPERATIVE IMPLANTS: Biomet Oxford mobile bearing medial compartment arthroplasty femur size large, tibia size E, bearing size 3.  OPERATIVE PROCEDURE: The patient was brought to the operating room placed in the supine position. Anesthesia was administered. IV antibiotics were given. The lower extremity was placed in the legholder and prepped and draped in usual sterile fashion.  Time out was performed.  The leg was elevated and exsanguinated and the tourniquet was inflated. Anteromedial incision was performed, and I took care to preserve the  MCL. Parapatellar incision was carried out, and the osteophytes were excised, along with the medial meniscus and a small portion of the fat pad.  The extra medullary tibial cutting jig was applied, using the spoon and the 11m G-Clamp and the 2 mm shim, and I took care to protect the anterior cruciate ligament insertion and the tibial spine. The medial collateral ligament was also protected, and I resected my proximal tibia, matching the anatomic slope.   The proximal tibial bony cut was removed in one piece, and I turned my attention to the femur.  The intramedullary femoral rod was placed using the drill, and then using the appropriate reference, I assembled the femoral jig, setting my posterior cutting block. I resected my posterior femur, used the 0 spigot for the anterior femur, and then measured my gap.   I then used the appropriate mill to match the extension gap to the flexion gap. The second milling was at a 3.  The gaps were then measured again with the appropriate feeler gauges. Once I had balanced flexion and extension gaps, I then completed the preparation of the femur.  I milled off the anterior aspect of the distal femur to prevent impingement. I also exposed the tibia, and selected the above-named component, and then used the cutting jig to prepare the keel slot on the tibia. I also used the awl to curette out the bone to complete the preparation of the keel. The back wall was intact.  I then placed trial components, and it was found to have excellent motion, and appropriate balance.  I then cemented the components into place, cementing the tibia first, removing all excess cement, and then cementing the  femur.  All loose cement was removed.  The real polyethylene insert was applied manually, and the knee was taken through functional range of motion, and found to have excellent stability and restoration of joint motion, with excellent balance.  The wounds were irrigated copiously, and  the parapatellar tissue closed with Vicryl, followed by Vicryl for the subcutaneous tissue, with routine closure with Steri-Strips and sterile gauze.  The tourniquet was released, and the patient was awakened and extubated and returned to PACU in stable and satisfactory condition. There were no complications.

## 2022-08-05 ENCOUNTER — Encounter (HOSPITAL_COMMUNITY): Payer: Self-pay | Admitting: Orthopedic Surgery

## 2022-10-28 ENCOUNTER — Ambulatory Visit (INDEPENDENT_AMBULATORY_CARE_PROVIDER_SITE_OTHER): Payer: BC Managed Care – PPO | Admitting: Infectious Diseases

## 2022-10-28 ENCOUNTER — Other Ambulatory Visit: Payer: Self-pay

## 2022-10-28 DIAGNOSIS — T8459XA Infection and inflammatory reaction due to other internal joint prosthesis, initial encounter: Secondary | ICD-10-CM | POA: Diagnosis not present

## 2022-10-28 DIAGNOSIS — Z96611 Presence of right artificial shoulder joint: Secondary | ICD-10-CM

## 2022-10-28 NOTE — Progress Notes (Signed)
Virtual Visit via Telephone Note  I connected with Chad Levy on 10/28/22 at  9:30 AM EDT by telephone and verified that I am speaking with the correct person using two identifiers.  Location: Patient: [place] residence Provider: [facility]   I discussed the limitations, risks, security and privacy concerns of performing an evaluation and management service by telephone and the availability of in person appointments. I also discussed with the patient that there may be a patient responsible charge related to this service. The patient expressed understanding and agreed to proceed.    History of Present Illness: Chad Levy has been doing well. Had a partial knee replacement earlier this year and recovered well from that.  He says his shoulder has been doing exceptionally well since her last visit in October of last year.  He has had no changes in range of motion or pain since stopping his antibiotics.  He is enjoying his motorcycle rides and has been released from orthopedics.      Observations/Objective: Pleasant.  Sounds to be in good health.   Assessment and Plan: Problem List Items Addressed This Visit       Unprioritized   Prosthetic shoulder infection, initial encounter (HCC) - Primary    Check in now 6 months after stopping antibiotics is completely reassuring for cure of his prosthetic shoulder infection due to the cuneiform acnes.  We discussed this on the phone today.  He can follow-up as needed         Follow Up Instructions: As above    I discussed the assessment and treatment plan with the patient. The patient was provided an opportunity to ask questions and all were answered. The patient agreed with the plan and demonstrated an understanding of the instructions.   The patient was advised to call back or seek an in-person evaluation if the symptoms worsen or if the condition fails to improve as anticipated.  I provided 6 minutes of non-face-to-face time during  this encounter including chart review related to current problem.    Rexene Alberts, NP

## 2022-10-28 NOTE — Assessment & Plan Note (Signed)
Check in now 6 months after stopping antibiotics is completely reassuring for cure of his prosthetic shoulder infection due to the cuneiform acnes.  We discussed this on the phone today.  He can follow-up as needed

## 2023-05-01 IMAGING — DX DG SHOULDER 1V*R*
1 series · 1 of 1 positions shown · non-contrast
Comparison: Preoperative CT 09/18/2021

CLINICAL DATA: Right shoulder replacement.

EXAM:
RIGHT SHOULDER - 1 VIEW

[shoulder obl]
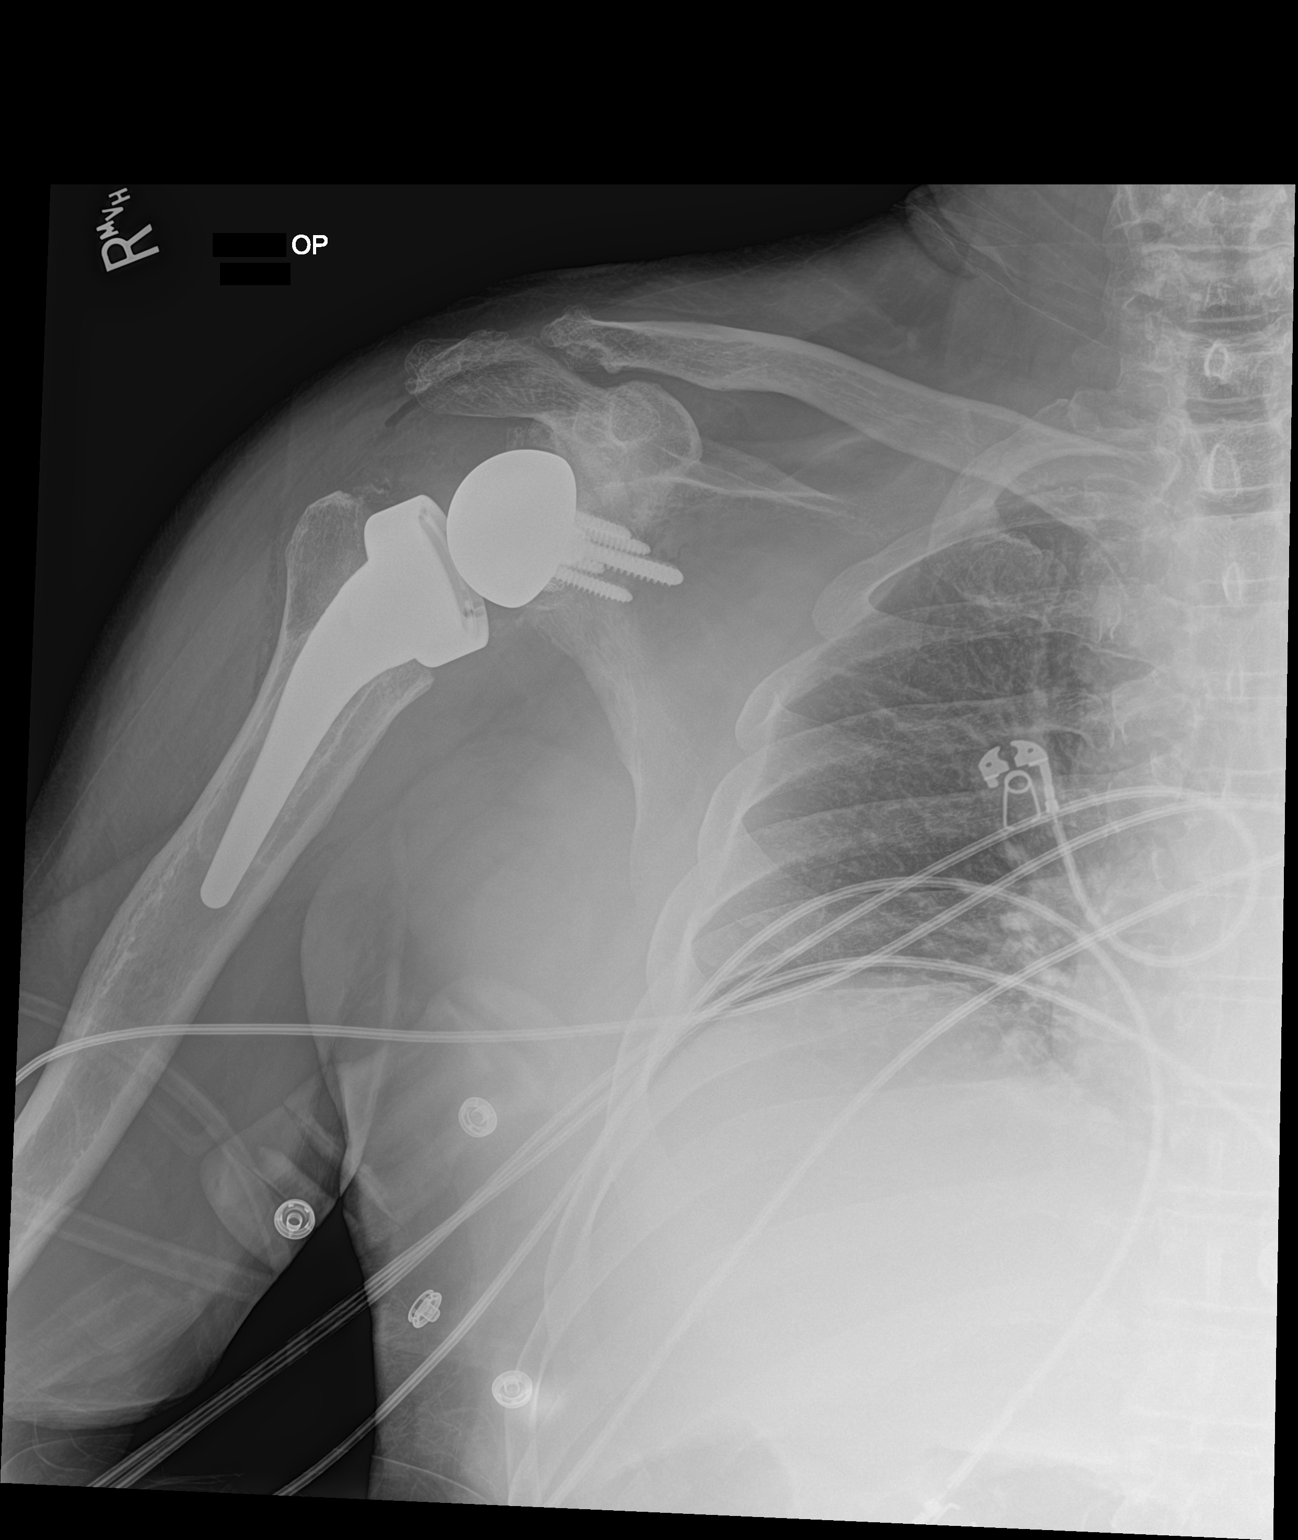

[1 of 1 positions shown; findings below may reference images not displayed]

FINDINGS: Previous arthroplasty has been converted to a reverse shoulder
arthroplasty. Expected alignment. No periprosthetic lucency or
fracture. Recent postsurgical change includes air and edema in the
joint space.
IMPRESSION: Reverse right shoulder arthroplasty without immediate postoperative
complication.

## 2023-05-10 IMAGING — XA IR PICC >5YO
1 series · 1 of 1 positions shown · non-contrast
Comparison: none

INDICATION: Right shoulder prosthesis, joint infection, requiring IV antibiotics

[Series 1: fl (-) angio · 1 of 1 slices shown]
[im 1/1]
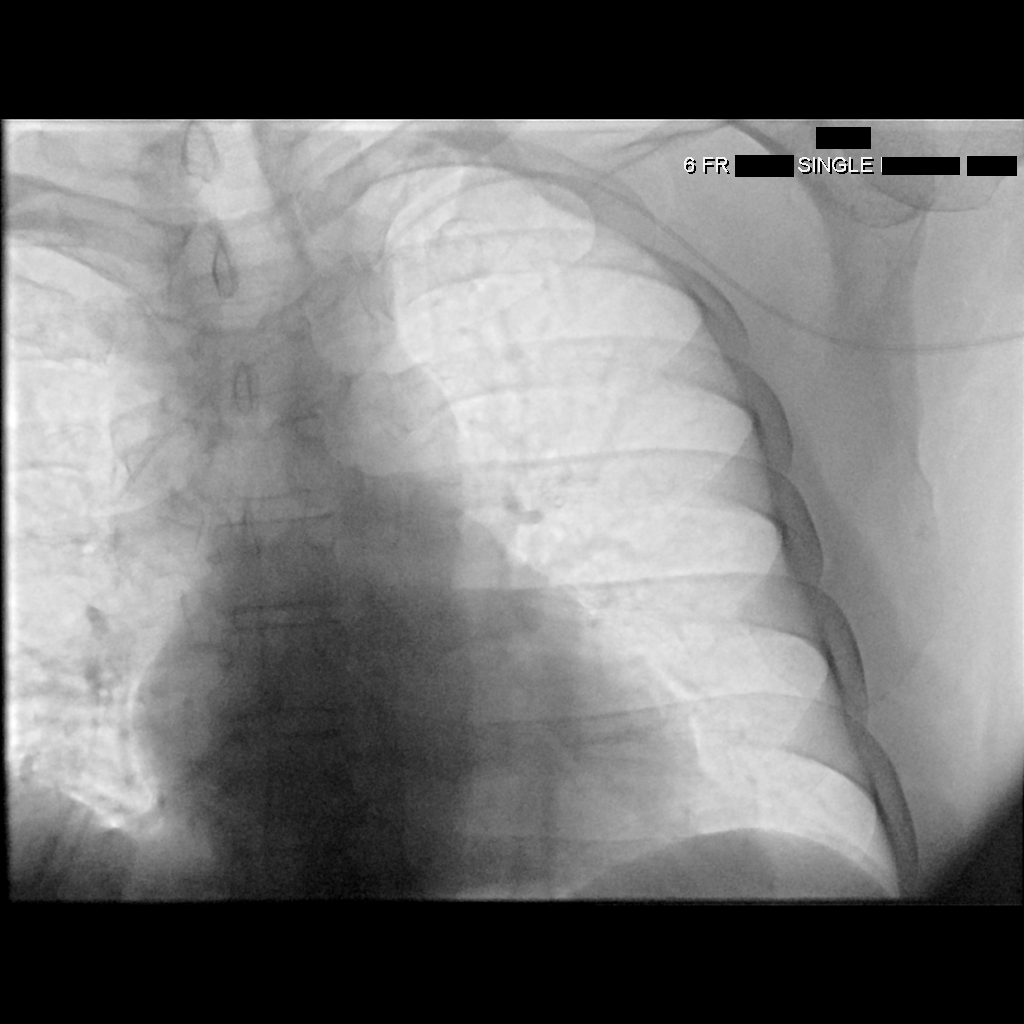

[1 of 1 positions shown; findings below may reference images not displayed]

EXAM:
SINGLE LUMEN PICC LINE PLACEMENT WITH ULTRASOUND AND FLUOROSCOPIC
GUIDANCE

MEDICATIONS:
None

ANESTHESIA/SEDATION:
Local 1% lidocaine.

FLUOROSCOPY:
Radiation Exposure Index (as provided by the fluoroscopic device): 1
mGy

COMPLICATIONS:
None immediate.

PROCEDURE:
The patient was advised of the possible risks and complications and
agreed to undergo the procedure. The patient was then brought to the
angiographic suite for the procedure.

The left arm was prepped with chlorhexidine, draped in the usual
sterile fashion using maximum barrier technique (cap and mask,
sterile gown, sterile gloves, large sterile sheet, hand hygiene and
cutaneous antisepsis) and infiltrated locally with 1% Lidocaine.

Ultrasound demonstrated patency of the left basilic vein, and this
was documented with an image. Under real-time ultrasound guidance,
this vein was accessed with a 21 gauge micropuncture needle and
image documentation was performed. A [DATE] wire was introduced in to
the vein. Over this, a 6 French single lumen PICC was advanced to
the SVC. Fluoroscopy during the procedure and fluoro spot radiograph
confirms catheter position. The catheter was flushed and covered
with a sterile dressing.

Catheter length: 41cm
IMPRESSION: Left arm PICC line placement with ultrasound and fluoroscopic
guidance. The catheter is ready for use.
